# Patient Record
Sex: Female | Born: 1970
Health system: Southern US, Community
[De-identification: ages and names within clinical notes are randomized; demographics above are authoritative.]

## PROBLEM LIST (undated history)

## (undated) DIAGNOSIS — D649 Anemia, unspecified: Secondary | ICD-10-CM

## (undated) DIAGNOSIS — T7840XA Allergy, unspecified, initial encounter: Secondary | ICD-10-CM

## (undated) DIAGNOSIS — J302 Other seasonal allergic rhinitis: Secondary | ICD-10-CM

## (undated) DIAGNOSIS — E785 Hyperlipidemia, unspecified: Secondary | ICD-10-CM

## (undated) DIAGNOSIS — M1990 Unspecified osteoarthritis, unspecified site: Secondary | ICD-10-CM

## (undated) HISTORY — PX: TONSILLECTOMY: SUR1361

## (undated) HISTORY — PX: WISDOM TOOTH EXTRACTION: SHX21

## (undated) HISTORY — DX: Allergy, unspecified, initial encounter: T78.40XA

## (undated) HISTORY — DX: Other seasonal allergic rhinitis: J30.2

## (undated) HISTORY — PX: EYE SURGERY: SHX253

## (undated) HISTORY — DX: Anemia, unspecified: D64.9

## (undated) HISTORY — PX: DILATION AND CURETTAGE OF UTERUS: SHX78

## (undated) HISTORY — DX: Unspecified osteoarthritis, unspecified site: M19.90

## (undated) HISTORY — DX: Hyperlipidemia, unspecified: E78.5

---

## 1999-02-08 ENCOUNTER — Other Ambulatory Visit: Admission: RE | Admit: 1999-02-08 | Discharge: 1999-02-08 | Payer: Self-pay | Admitting: Obstetrics & Gynecology

## 1999-06-18 ENCOUNTER — Ambulatory Visit (HOSPITAL_COMMUNITY): Admission: RE | Admit: 1999-06-18 | Discharge: 1999-06-18 | Payer: Self-pay | Admitting: Obstetrics and Gynecology

## 1999-09-10 ENCOUNTER — Inpatient Hospital Stay (HOSPITAL_COMMUNITY): Admission: AD | Admit: 1999-09-10 | Discharge: 1999-09-11 | Payer: Self-pay | Admitting: Obstetrics & Gynecology

## 1999-10-06 ENCOUNTER — Other Ambulatory Visit: Admission: RE | Admit: 1999-10-06 | Discharge: 1999-10-06 | Payer: Self-pay | Admitting: Obstetrics and Gynecology

## 2000-11-28 ENCOUNTER — Other Ambulatory Visit: Admission: RE | Admit: 2000-11-28 | Discharge: 2000-11-28 | Payer: Self-pay | Admitting: Obstetrics and Gynecology

## 2001-09-05 ENCOUNTER — Inpatient Hospital Stay (HOSPITAL_COMMUNITY): Admission: AD | Admit: 2001-09-05 | Discharge: 2001-09-05 | Payer: Self-pay | Admitting: Obstetrics & Gynecology

## 2001-09-22 ENCOUNTER — Inpatient Hospital Stay (HOSPITAL_COMMUNITY): Admission: AD | Admit: 2001-09-22 | Discharge: 2001-09-22 | Payer: Self-pay | Admitting: Obstetrics & Gynecology

## 2001-12-01 ENCOUNTER — Inpatient Hospital Stay (HOSPITAL_COMMUNITY): Admission: AD | Admit: 2001-12-01 | Discharge: 2001-12-03 | Payer: Self-pay | Admitting: Obstetrics and Gynecology

## 2002-01-03 ENCOUNTER — Other Ambulatory Visit: Admission: RE | Admit: 2002-01-03 | Discharge: 2002-01-03 | Payer: Self-pay | Admitting: Obstetrics and Gynecology

## 2005-05-06 ENCOUNTER — Other Ambulatory Visit: Admission: RE | Admit: 2005-05-06 | Discharge: 2005-05-06 | Payer: Self-pay | Admitting: Obstetrics & Gynecology

## 2008-01-02 ENCOUNTER — Encounter: Admission: RE | Admit: 2008-01-02 | Discharge: 2008-01-02 | Payer: Self-pay | Admitting: Family Medicine

## 2009-06-01 ENCOUNTER — Ambulatory Visit: Payer: Self-pay | Admitting: Family Medicine

## 2009-06-01 DIAGNOSIS — Z8659 Personal history of other mental and behavioral disorders: Secondary | ICD-10-CM | POA: Insufficient documentation

## 2009-06-01 DIAGNOSIS — E039 Hypothyroidism, unspecified: Secondary | ICD-10-CM | POA: Insufficient documentation

## 2009-06-01 DIAGNOSIS — Z862 Personal history of diseases of the blood and blood-forming organs and certain disorders involving the immune mechanism: Secondary | ICD-10-CM | POA: Insufficient documentation

## 2009-06-10 ENCOUNTER — Encounter: Admission: RE | Admit: 2009-06-10 | Discharge: 2009-06-10 | Payer: Self-pay | Admitting: Family Medicine

## 2009-06-11 ENCOUNTER — Encounter: Payer: Self-pay | Admitting: Family Medicine

## 2009-06-18 ENCOUNTER — Ambulatory Visit: Payer: Self-pay | Admitting: Family Medicine

## 2009-06-18 LAB — CONVERTED CEMR LAB
ALT: 16 units/L (ref 0–35)
Albumin: 4.3 g/dL (ref 3.5–5.2)
Alkaline Phosphatase: 43 units/L (ref 39–117)
BUN: 16 mg/dL (ref 6–23)
Bilirubin, Direct: 0.1 mg/dL (ref 0.0–0.3)
Calcium: 9.4 mg/dL (ref 8.4–10.5)
Creatinine, Ser: 0.8 mg/dL (ref 0.4–1.2)
Eosinophils Absolute: 0.1 10*3/uL (ref 0.0–0.7)
Eosinophils Relative: 2.7 % (ref 0.0–5.0)
HCT: 39.3 % (ref 36.0–46.0)
LDL Cholesterol: 115 mg/dL — ABNORMAL HIGH (ref 0–99)
Lymphocytes Relative: 37.6 % (ref 12.0–46.0)
Lymphs Abs: 1.6 10*3/uL (ref 0.7–4.0)
MCV: 95.9 fL (ref 78.0–100.0)
Neutro Abs: 2.1 10*3/uL (ref 1.4–7.7)
Neutrophils Relative %: 50.2 % (ref 43.0–77.0)
Nitrite: NEGATIVE
Platelets: 243 10*3/uL (ref 150.0–400.0)
RDW: 14.4 % (ref 11.5–14.6)
TSH: 3.08 microintl units/mL (ref 0.35–5.50)
Total Bilirubin: 0.6 mg/dL (ref 0.3–1.2)
Total CHOL/HDL Ratio: 3
Triglycerides: 53 mg/dL (ref 0.0–149.0)
Urine Glucose: NEGATIVE mg/dL

## 2009-06-25 ENCOUNTER — Ambulatory Visit: Payer: Self-pay | Admitting: Family Medicine

## 2009-12-14 ENCOUNTER — Ambulatory Visit: Payer: Self-pay | Admitting: Family Medicine

## 2009-12-14 DIAGNOSIS — L259 Unspecified contact dermatitis, unspecified cause: Secondary | ICD-10-CM | POA: Insufficient documentation

## 2010-04-15 NOTE — Assessment & Plan Note (Signed)
Summary: rash on legs/back and side/cjr   Vital Signs:  Patient profile:   40 year old female Menstrual status:  regular Height:      68 inches (172.72 cm) Weight:      158 pounds (71.82 kg) O2 Sat:      98 % on Room air Temp:     98.6 degrees F (37.00 degrees C) oral Pulse rate:   58 / minute BP sitting:   102 / 70  (left arm) Cuff size:   regular  Vitals Entered By: Josph Macho RMA (December 14, 2009 10:30 AM)  O2 Flow:  Room air CC: Rash on legs, back, and side X2 weeks- itching/ CF, Headache Is Patient Diabetic? No   History of Present Illness: Patient is in todayf or evaluaiton of a pruritic rash which has been present for about 2 weeks. She describes the rash is worse over her posterior left buttock. This is where started she has a small scar from where she scratched and smaller newer lesions just below it. She notes that the itching does not keep her up it is often worse upon first arising in the morning right before bedtime q.h.s. She says the lesions are primarily posterior neck spasm on her anterior abdomen. She notes the first lesion cannot just after bathing suit line of the left posterior hip but she has not been swimming in the pool for about a month and the lesions just began 2 weeks ago. She is to use hydrocortisone cream for temporary relief but has not tried any other medications. No one else in her household including several children and her husband have similar lesions. She denies any new products she does note she has recently gone back to using. Ex after using another product for a period of time. No other signs of illness no recent fevers, chills, chest pain, palpitations, respiratory, GI or GU complaints noted at today's visit. She denies any history of similar lesions in the past.  Current Medications (verified): 1)  Ester-C 1000-50 Mg Tabs (Bioflavonoid Products) .... Once Daily 2)  Glucosamine-Chondroitin  Caps (Glucosamine-Chondroit-Vit C-Mn) .... Once  Daily 3)  Womens Multivitamin Plus  Tabs (Multiple Vitamins-Minerals) .... Once Daily 4)  Melatonin 3 Mg Caps (Melatonin) .... At Bedtime 5)  Sam-E 200 Mg Tabs (S-Adenosylmethionine) .... Once Daily 6)  Black Cohosh Root 540 Mg Caps (Black Cohosh) .... Once Daily  Allergies (verified): No Known Drug Allergies  Past History:  Past medical history reviewed for relevance to current acute and chronic problems. Social history (including risk factors) reviewed for relevance to current acute and chronic problems.  Past Medical History: Reviewed history from 06/01/2009 and no changes required. Anemia, hx of Depression, childhood UTI history  Social History: Reviewed history from 06/01/2009 and no changes required. Occupation:  Designer, jewellery Married Alcohol use-no Regular exercise-yes Past smoker, quit 1991  Review of Systems      See HPI  Physical Exam  General:  Well-developed,well-nourished,in no acute distress; alert,appropriate and cooperative throughout examination Head:  Normocephalic and atraumatic without obvious abnormalities. No apparent alopecia or balding. Mouth:  Oral mucosa and oropharynx without lesions or exudates.  Teeth in good repair. Neck:  No deformities, masses, or tenderness noted. Lungs:  Normal respiratory effort, chest expands symmetrically. Lungs are clear to auscultation, no crackles or wheezes. Heart:  Normal rate and regular rhythm. S1 and S2 normal without gallop, murmur, click, rub or other extra sounds. Abdomen:  Bowel sounds positive,abdomen soft and non-tender without masses,  organomegaly or hernias noted. Msk:  No deformity or scoliosis noted of thoracic or lumbar spine.   Extremities:  No clubbing, cyanosis, edema, or deformity noted with normal full range of motion of all joints.   Skin:  left buttock mild scarring noted, circular violaceous spot noted and just caudal are several smaller erythematous spots that are excoriated and slightly  raised to obvious tracking noted betweeen lesions. She has some other scattered pin pinpoint papular, erythematous lesions scattered over legs and buttocks, as well as left arm and lower abd  Psych:  Cognition and judgment appear intact. Alert and cooperative with normal attention span and concentration. No apparent delusions, illusions, hallucinations   Impression & Recommendations:  Problem # 1:  DERMATITIS (ICD-692.9)  Her updated medication list for this problem includes:    Cetirizine Hcl 10 Mg Tabs (Cetirizine hcl) .Marland Kitchen... 1 tab by mouth two times a day as needed allergies Zoonotic vs allergic or some combination Will rx with Permethrin cream with deep cleaning of environment, consider stopping Purex detergent if lesions continue to recur. May treat pruritus with Wynelle Fanny and [facility] as needed. Report if lesions do not resolve  Complete Medication List: 1)  Ester-c 1000-50 Mg Tabs (Bioflavonoid products) .... Once daily 2)  Glucosamine-chondroitin Caps (Glucosamine-chondroit-vit c-mn) .... Once daily 3)  Womens Multivitamin Plus Tabs (Multiple vitamins-minerals) .... Once daily 4)  Melatonin 3 Mg Caps (Melatonin) .... At bedtime 5)  Sam-e 200 Mg Tabs (S-adenosylmethionine) .... Once daily 6)  Black Cohosh Root 540 Mg Caps (Black cohosh) .... Once daily 7)  Cetirizine Hcl 10 Mg Tabs (Cetirizine hcl) .Marland Kitchen.. 1 tab by mouth two times a day as needed allergies 8)  Sarna 0.5-0.5 % Lotn (Camphor-menthol) .... Apply as needed itching/rash 9)  Permethrin 5 % Crea (Permethrin) .... Apply at bedtime head to toe and wash off in am after 8-10 hrs, may repeat in 1 week if needed   Patient Instructions: 1)  Please schedule a follow-up appointment as needed if symptoms worsen. 2)  Use Witch Hazel Astringent as needed for bug bites, rash or itching 3)  Sarna lotion as needed for itching 4)  Avoid any new products if no improvement Prescriptions: PERMETHRIN 5 % CREA (PERMETHRIN) apply at bedtime head  to toe and wash off in am after 8-10 hrs, may repeat in 1 week if needed  #1 tube x 1   Entered and Authorized by:   Danise Edge MD   Signed by:   Danise Edge MD on 12/14/2009   Method used:   Electronically to        [id number] (retail)       [address]       [place], [place]  [address]       Ph: [phone]       Fax: [phone]   RxID:   [id number] CETIRIZINE HCL 10 MG TABS (CETIRIZINE HCL) 1 tab by mouth two times a day as needed allergies  #60 x 2   Entered and Authorized by:   Danise Edge MD   Signed by:   Danise Edge MD on 12/14/2009   Method used:   Historical   RxID:   [id number]

## 2010-04-15 NOTE — Assessment & Plan Note (Signed)
Summary: est [place]//ccm   Vital Signs:  Patient profile:   40 year old female Menstrual status:  regular LMP:     05/12/2009 Height:      68.0 inches Weight:      159 pounds BMI:     24.26 Temp:     98.5 degrees F oral Pulse rate:   72 / minute Pulse rhythm:   regular Resp:     12 per minute BP sitting:   110 / 72  (left arm) Cuff size:   regular  Vitals Entered By: Sid Falcon LPN (June 01, 2009 3:22 PM) CC: New to establish,  LMP (date): 05/12/2009     Menstrual Status regular Enter LMP: 05/12/2009   History of Present Illness: New patient to establish care.  Past medical history reviewed. Remote history of anemia. Remote history of depression in childhood but not for several years. Reported past history of borderline hypothyroidism. History of frequent UTIs in the past. C-Section 1988. Tonsillectomy 1980. Takes over-the-counter supplements. No known drug allergies.    Hypothyroidism has not been evaluated in some time. She was briefly on medication but apparently her labs normalized. No recent weight changes or constipation or other concerning symptoms for hypothyroidism.  Remote history of anemia. Normal menses. No recent dizziness or syncope.  History of depression and childhood. No history of recurrent depression. Current symptoms stable.  Family history significant for father with hypertension and hyperlipidemia. Paternal grandfather with heart disease age 85. Mother with history of depression.  Patient is married and has 5 children. Nonsmoker. No alcohol use. 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  Patient like to schedule complete physical examination. Last tetanus unknown. Last Pap smear 2009.  Preventive Screening-Counseling & Management  Alcohol-Tobacco     Smoking Status: quit     Year Started: 1985     Year Quit: 1991  Caffeine-Diet-Exercise     Does Patient Exercise: yes  Allergies (verified): No Known Drug Allergies  Past  History:  Family History: Last updated: 06/01/2009 Emotional/mental illness, arthritis, mother Heart disease, hypertension, father Breast ccancer, grandmother Kidney disease, grandparent  Social History: Last updated: 06/01/2009 Occupation:  Registered Nurse Married Alcohol use-no Regular exercise-yes Past smoker, quit 1991  Risk Factors: Exercise: yes (06/01/2009)  Risk Factors: Smoking Status: quit (06/01/2009)  Past Medical History: Anemia, hx of Depression, childhood UTI history  Past Surgical History: Caesarean section 1988 Tonsillectomy 1980 PMH-FH-SH reviewed for relevance  Family History: Emotional/mental illness, arthritis, mother Heart disease, hypertension, father Breast ccancer, grandmother Kidney disease, grandparent  Social History: Occupation:  Designer, jewellery Married Alcohol use-no Regular exercise-yes Past smoker, quit 1991 Smoking Status:  quit Occupation:  employed Does Patient Exercise:  yes  Review of Systems  The patient denies anorexia, fever, weight loss, weight gain, chest pain, syncope, dyspnea on exertion, peripheral edema, prolonged cough, headaches, hemoptysis, abdominal pain, melena, hematochezia, severe indigestion/heartburn, suspicious skin lesions, depression, and enlarged lymph nodes.    Physical Exam  General:  Well-developed,well-nourished,in no acute distress; alert,appropriate and cooperative throughout examination Head:  Normocephalic and atraumatic without obvious abnormalities. No apparent alopecia or balding. Eyes:  No corneal or conjunctival inflammation noted. EOMI. Perrla. Funduscopic exam benign, without hemorrhages, exudates or papilledema. Vision grossly normal. Ears:  External ear exam shows no significant lesions or deformities.  Otoscopic examination reveals clear canals, tympanic membranes are intact bilaterally without bulging, retraction, inflammation or discharge. Hearing is grossly normal  bilaterally. Mouth:  Oral mucosa and oropharynx without lesions or exudates.  Teeth  in good repair. Neck:  No deformities, masses, or tenderness noted. Lungs:  Normal respiratory effort, chest expands symmetrically. Lungs are clear to auscultation, no crackles or wheezes. Heart:  Normal rate and regular rhythm. S1 and S2 normal without gallop, murmur, click, rub or other extra sounds. Extremities:  No clubbing, cyanosis, edema, or deformity noted with normal full range of motion of all joints.   Neurologic:  alert & oriented X3 and strength normal in all extremities.   Psych:  normally interactive, good eye contact, not anxious appearing, and not depressed appearing.     Impression & Recommendations:  Problem # 1:  ANEMIA, HX OF (ICD-V12.3)  Problem # 2:  DEPRESSION, MAJOR, HX OF (ICD-V11.8)  Problem # 3:  HYPOTHYROIDISM, BORDERLINE (ICD-244.9) Schedule physical and get followup labs.  Complete Medication List: 1)  Ester-c 1000-50 Mg Tabs (Bioflavonoid products) .... Once daily 2)  Glucosamine-chondroitin Caps (Glucosamine-chondroit-vit c-mn) .... Once daily 3)  Womens Multivitamin Plus Tabs (Multiple vitamins-minerals) .... Once daily 4)  Melatonin 3 Mg Caps (Melatonin) .... At bedtime 5)  Sam-e 200 Mg Tabs (S-adenosylmethionine) .... Once daily 6)  Black Cohosh Root 540 Mg Caps (Black cohosh) .... Once daily  Patient Instructions: 1)  Schedule complete physical examination

## 2010-04-15 NOTE — Assessment & Plan Note (Signed)
Summary: cpx----no pap//ccm   Vital Signs:  Patient profile:   40 year old female Menstrual status:  regular Height:      68 inches Weight:      156 pounds Temp:     98.7 degrees F oral Pulse rate:   72 / minute Pulse rhythm:   regular Resp:     12 per minute BP sitting:   102 / 68  (left arm) Cuff size:   regular  Vitals Entered By: Sid Falcon LPN (June 25, 2009 2:58 PM) CC: CPX   History of Present Illness: Patient here for complete physical examination. Gets GYN care elsewhere.  Recent mammogram normal. Patient recently noted right breast lump normal by ultrasound. Last tetanus unknown. Patient exercises walking 5-6 days per week.  Family history and social history reviewed. No significant changes.  Allergies (verified): No Known Drug Allergies  Past History:  Past Medical History: Last updated: 06/01/2009 Anemia, hx of Depression, childhood UTI history  Past Surgical History: Last updated: 06/01/2009 Caesarean section 1988 Tonsillectomy 1980  Family History: Last updated: 06/01/2009 Emotional/mental illness, arthritis, mother Heart disease, hypertension, father Breast ccancer, grandmother Kidney disease, grandparent  Social History: Last updated: 06/01/2009 Occupation:  Registered Nurse Married Alcohol use-no Regular exercise-yes Past smoker, quit 1991  Risk Factors: Exercise: yes (06/01/2009)  Risk Factors: Smoking Status: quit (06/01/2009)  Review of Systems  The patient denies anorexia, fever, weight loss, weight gain, vision loss, decreased hearing, hoarseness, chest pain, syncope, dyspnea on exertion, peripheral edema, prolonged cough, headaches, hemoptysis, abdominal pain, melena, hematochezia, severe indigestion/heartburn, hematuria, incontinence, genital sores, muscle weakness, suspicious skin lesions, transient blindness, difficulty walking, depression, and enlarged lymph nodes.    Physical Exam  General:   Well-developed,well-nourished,in no acute distress; alert,appropriate and cooperative throughout examination Head:  Normocephalic and atraumatic without obvious abnormalities. No apparent alopecia or balding. Eyes:  No corneal or conjunctival inflammation noted. EOMI. Perrla. Funduscopic exam benign, without hemorrhages, exudates or papilledema. Vision grossly normal. Ears:  External ear exam shows no significant lesions or deformities.  Otoscopic examination reveals clear canals, tympanic membranes are intact bilaterally without bulging, retraction, inflammation or discharge. Hearing is grossly normal bilaterally. Mouth:  Oral mucosa and oropharynx without lesions or exudates.  Teeth in good repair. Neck:  No deformities, masses, or tenderness noted. Lungs:  Normal respiratory effort, chest expands symmetrically. Lungs are clear to auscultation, no crackles or wheezes. Heart:  Normal rate and regular rhythm. S1 and S2 normal without gallop, murmur, click, rub or other extra sounds. Abdomen:  Bowel sounds positive,abdomen soft and non-tender without masses, organomegaly or hernias noted. Genitalia:  gyn Msk:  No deformity or scoliosis noted of thoracic or lumbar spine.   Extremities:  No clubbing, cyanosis, edema, or deformity noted with normal full range of motion of all joints.   Leg length ASIS to med malleolus 92 cm bilaterally. Neurologic:  alert & oriented X3, cranial nerves II-XII intact, strength normal in all extremities, and gait normal.   Skin:  Intact without suspicious lesions or rashes Cervical Nodes:  No lymphadenopathy noted Psych:  normally interactive, good eye contact, not anxious appearing, and not depressed appearing.     Impression & Recommendations:  Problem # 1:  Preventive Health Care (ICD-V70.0) labs reviewed with patient and all favorable. TD booster given. Continue regular exercise.  Complete Medication List: 1)  Ester-c 1000-50 Mg Tabs (Bioflavonoid products)  .... Once daily 2)  Glucosamine-chondroitin Caps (Glucosamine-chondroit-vit c-mn) .... Once daily 3)  Womens Multivitamin Plus Tabs (Multiple  vitamins-minerals) .... Once daily 4)  Melatonin 3 Mg Caps (Melatonin) .... At bedtime 5)  Sam-e 200 Mg Tabs (S-adenosylmethionine) .... Once daily 6)  Black Cohosh Root 540 Mg Caps (Black cohosh) .... Once daily  Other Orders: Tdap => 60yrs IM (16109) Admin 1st Vaccine (60454)  Patient Instructions: 1)  It is important that you exercise reguarly at least 20 minutes 5 times a week. If you develop chest pain, have severe difficulty breathing, or feel very tired, stop exercising immediately and seek medical attention.    Immunizations Administered:  Tetanus Vaccine:    Vaccine Type: Tdap    Site: left deltoid    Mfr: GlaxoSmithKline    Dose: 0.5 ml    Route: IM    Given by: Sid Falcon LPN    Exp. Date: 06/06/2011    Lot #: [id number]

## 2011-06-22 IMAGING — MG MM DIAGNOSTIC BILATERAL
6 series · 6 of 6 positions shown · non-contrast
Comparison: January 02, 2008

CLINICAL DATA: Palpable lump at the right breast

DIGITAL DIAGNOSTIC BILATERAL MAMMOGRAM WITH CAD AND RIGHT BREAST
ULTRASOUND:

[R CC (1 of 2)]
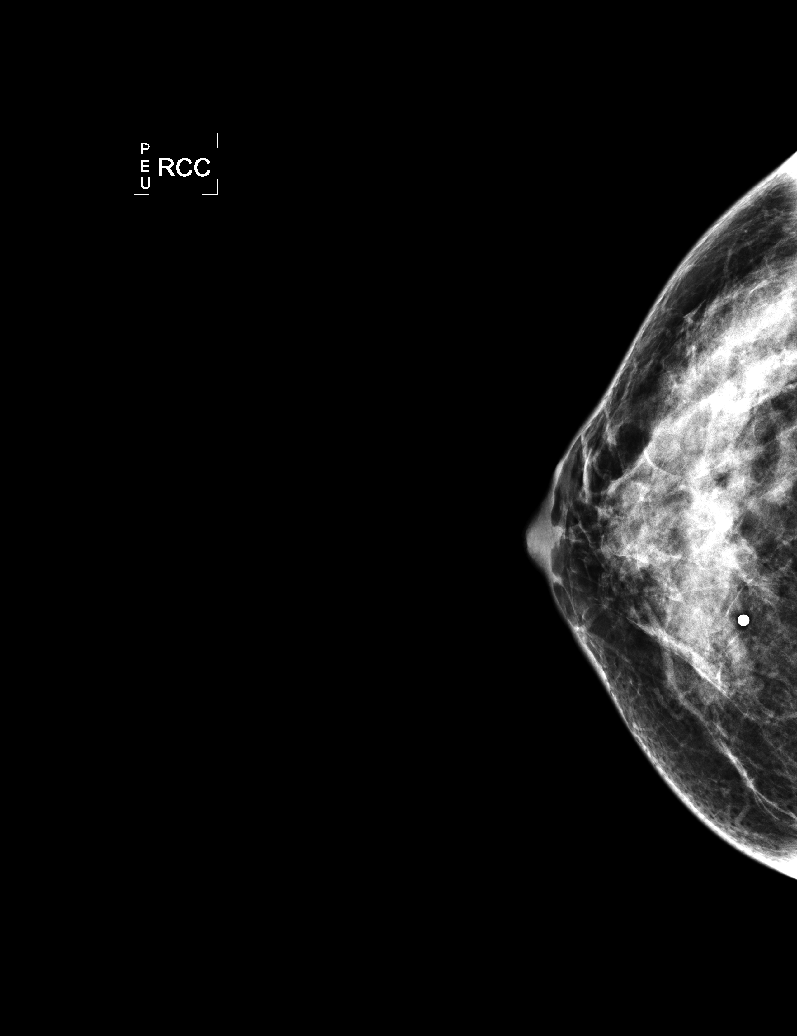

[L CC]
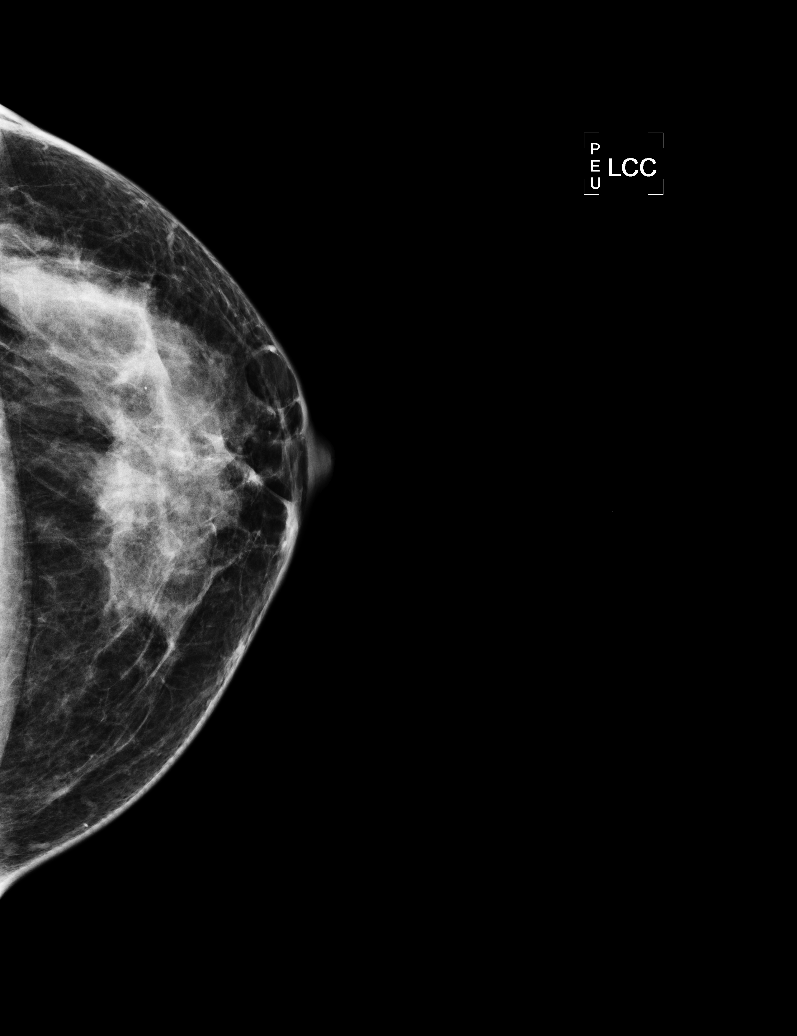

[L MLO]
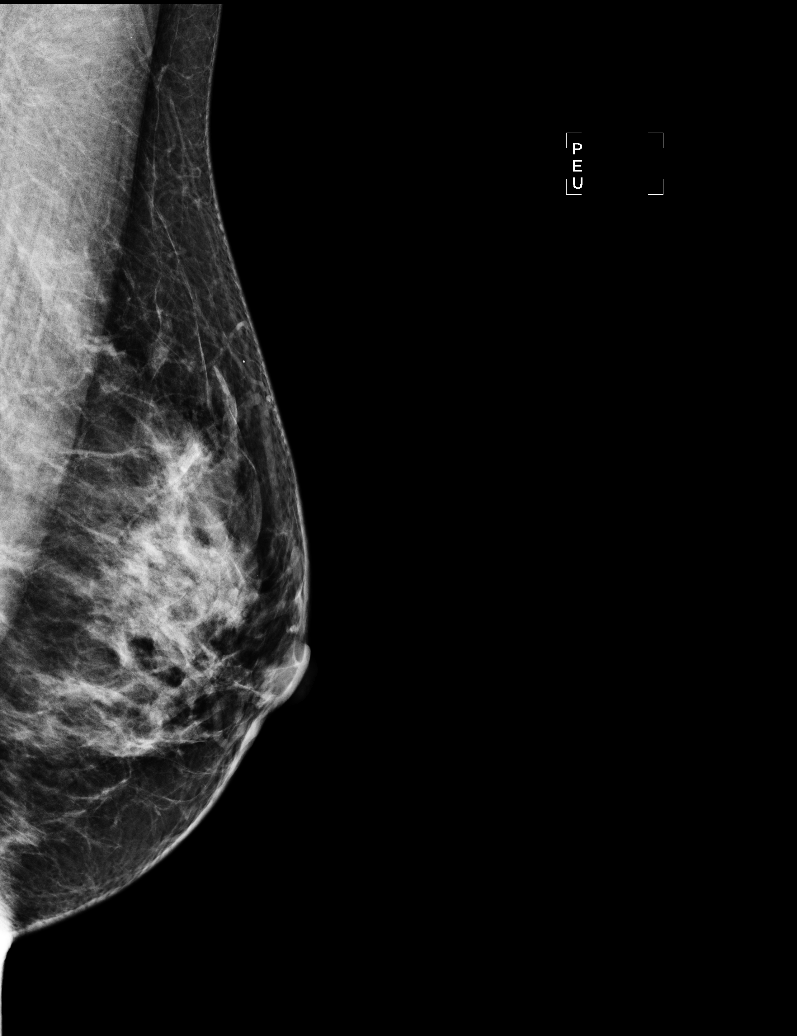

[R MLO]
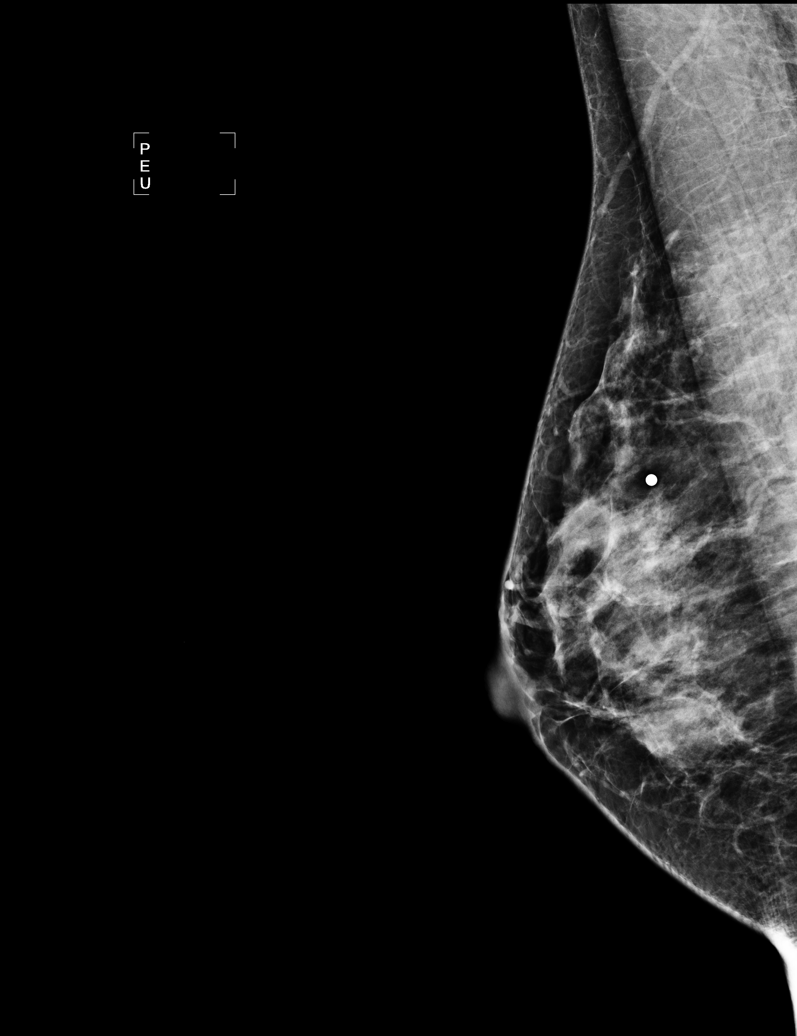

[R TAN]
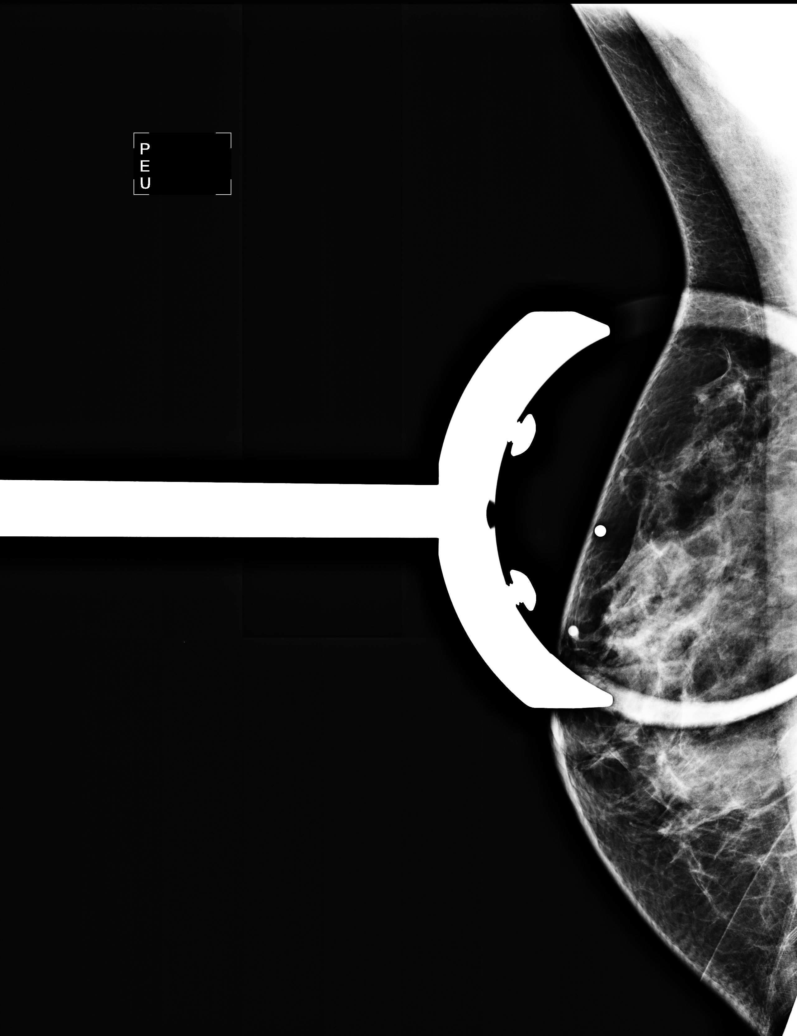

[R CC (2 of 2)]
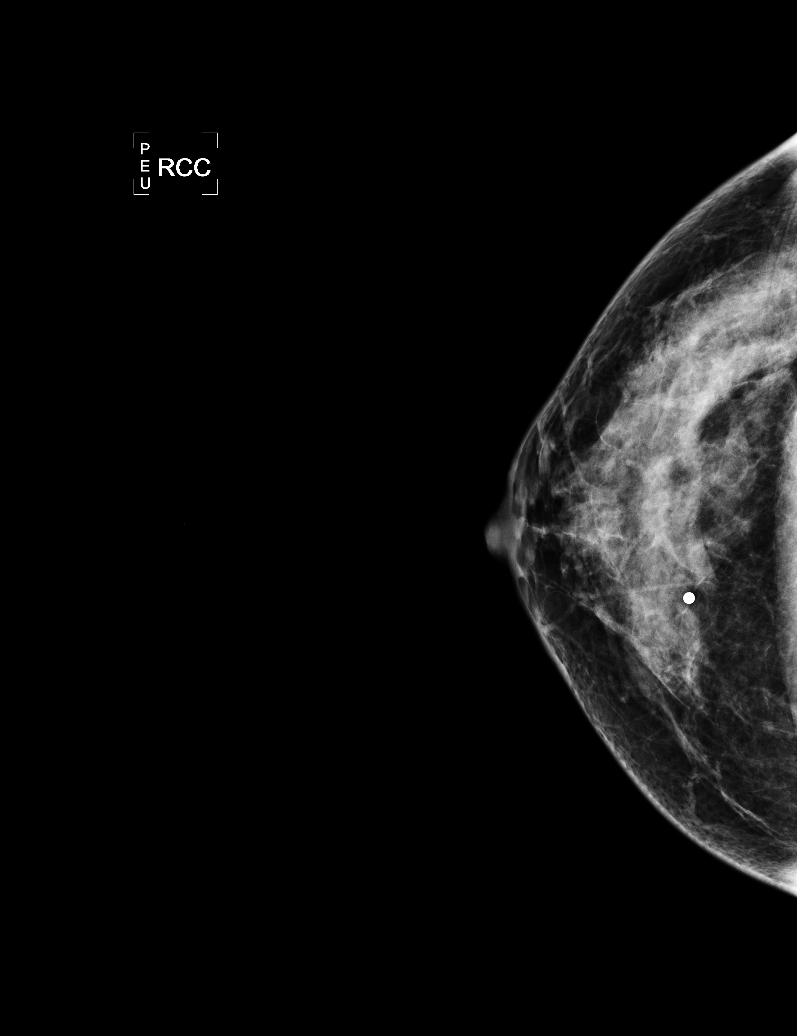

[6 of 6 positions shown; findings below may reference images not displayed]

FINDINGS: CC and MLO views of bilateral breasts, spot tangential
view of right breast are submitted.  The breast parenchyma is
dense, lowering the sensitivity of mammography.  No suspicious
abnormality is identified.
Mammographic images were processed with CAD.

Ultrasound is performed, showing no focal discrete cystic or solid
lesion at the palpable area right breast one o'clock position.
IMPRESSION: Negative, recommend routine screening mammogram at age 41.

BI-RADS CATEGORY 1:  Negative.

## 2013-11-11 ENCOUNTER — Other Ambulatory Visit: Payer: Self-pay | Admitting: Obstetrics and Gynecology

## 2013-11-11 DIAGNOSIS — N6310 Unspecified lump in the right breast, unspecified quadrant: Secondary | ICD-10-CM

## 2013-11-21 ENCOUNTER — Encounter (INDEPENDENT_AMBULATORY_CARE_PROVIDER_SITE_OTHER): Payer: Self-pay

## 2013-11-21 ENCOUNTER — Ambulatory Visit
Admission: RE | Admit: 2013-11-21 | Discharge: 2013-11-21 | Disposition: A | Discharge status: Home / Self Care | Payer: Commercial Managed Care - PPO | Source: Ambulatory Visit | Attending: Obstetrics and Gynecology | Admitting: Obstetrics and Gynecology

## 2013-11-21 DIAGNOSIS — N6310 Unspecified lump in the right breast, unspecified quadrant: Secondary | ICD-10-CM

## 2018-02-12 ENCOUNTER — Encounter: Payer: Self-pay | Admitting: Family Medicine

## 2018-02-12 ENCOUNTER — Other Ambulatory Visit: Payer: Self-pay

## 2018-02-12 ENCOUNTER — Ambulatory Visit (INDEPENDENT_AMBULATORY_CARE_PROVIDER_SITE_OTHER): Payer: 59 | Admitting: Family Medicine

## 2018-02-12 VITALS — BP 96/60 | HR 80 | Temp 98.2°F | Ht 65.75 in | Wt 180.0 lb

## 2018-02-12 DIAGNOSIS — Z0000 Encounter for general adult medical examination without abnormal findings: Secondary | ICD-10-CM

## 2018-02-12 LAB — BASIC METABOLIC PANEL
BUN: 14 mg/dL (ref 6–23)
CHLORIDE: 102 meq/L (ref 96–112)
CO2: 26 mEq/L (ref 19–32)
Calcium: 9.4 mg/dL (ref 8.4–10.5)
Creatinine, Ser: 0.9 mg/dL (ref 0.40–1.20)
GFR: 71.4 mL/min (ref >60.00)
Glucose, Bld: 87 mg/dL (ref 70–99)
POTASSIUM: 4.5 meq/L (ref 3.5–5.1)
SODIUM: 138 meq/L (ref 135–145)

## 2018-02-12 LAB — CBC WITH DIFFERENTIAL/PLATELET
BASOS ABS: 0.1 10*3/uL (ref 0.0–0.1)
Basophils Relative: 1.2 % (ref 0.0–3.0)
Eosinophils Absolute: 0.1 10*3/uL (ref 0.0–0.7)
Eosinophils Relative: 3 % (ref 0.0–5.0)
HEMATOCRIT: 37.1 % (ref 36.0–46.0)
HEMOGLOBIN: 12.2 g/dL (ref 12.0–15.0)
LYMPHS PCT: 33.4 % (ref 12.0–46.0)
Lymphs Abs: 1.6 10*3/uL (ref 0.7–4.0)
MCHC: 33 g/dL (ref 30.0–36.0)
MCV: 89.7 fl (ref 78.0–100.0)
MONOS PCT: 8.2 % (ref 3.0–12.0)
Monocytes Absolute: 0.4 10*3/uL (ref 0.1–1.0)
Neutro Abs: 2.5 10*3/uL (ref 1.4–7.7)
Neutrophils Relative %: 54.2 % (ref 43.0–77.0)
Platelets: 385 10*3/uL (ref 150.0–400.0)
RBC: 4.13 Mil/uL (ref 3.87–5.11)
RDW: 16.2 % — ABNORMAL HIGH (ref 11.5–15.5)
WBC: 4.7 10*3/uL (ref 4.0–10.5)

## 2018-02-12 LAB — HEPATIC FUNCTION PANEL
ALK PHOS: 55 U/L (ref 39–117)
ALT: 15 U/L (ref 0–35)
AST: 17 U/L (ref 0–37)
Albumin: 4.2 g/dL (ref 3.5–5.2)
BILIRUBIN DIRECT: 0.1 mg/dL (ref 0.0–0.3)
TOTAL PROTEIN: 6.9 g/dL (ref 6.0–8.3)
Total Bilirubin: 0.5 mg/dL (ref 0.2–1.2)

## 2018-02-12 LAB — LIPID PANEL
CHOLESTEROL: 216 mg/dL — AB (ref 0–200)
HDL: 64 mg/dL (ref >39.00)
LDL Cholesterol: 131 mg/dL — ABNORMAL HIGH (ref 0–99)
NONHDL: 151.67
TRIGLYCERIDES: 102 mg/dL (ref 0.0–149.0)
Total CHOL/HDL Ratio: 3
VLDL: 20.4 mg/dL (ref 0.0–40.0)

## 2018-02-12 LAB — TSH: TSH: 2.81 u[IU]/mL (ref 0.35–4.50)

## 2018-02-12 NOTE — Progress Notes (Signed)
  Subjective:     Patient ID: Judith Pitts, female   DOB: April 01, 1970, 47 y.o.   MRN: [id number]  HPI Patient seen for physical exam.  She sees gynecologist yearly and gets Pap smears and mammograms from them.  She still needs flu vaccine.  Will need tetanus booster in 2 years.  No family history of colon cancer.  She takes no medications regularly.  She has history of iron deficiency secondary to heavy menses.  Her GYN has her on iron replacement.  She states her last hemoglobin was 11 range.  She has been frustrated with gradual weight gain.  Currently not exercising regularly.  Past Medical History:  Diagnosis Date  . Allergy    Past Surgical History:  Procedure Laterality Date  . CESAREAN SECTION  1988  . EYE SURGERY    . TONSILLECTOMY      reports that she has quit smoking. She has never used smokeless tobacco. She reports that she drinks about 2.0 - 3.0 standard drinks of alcohol per week. She reports that she has current or past drug history. Drug: Marijuana. family history is not on file. No Known Allergies   Review of Systems  Constitutional: Negative for activity change, appetite change, fatigue, fever and unexpected weight change.  HENT: Negative for ear pain, hearing loss, sore throat and trouble swallowing.   Eyes: Negative for visual disturbance.  Respiratory: Negative for cough and shortness of breath.   Cardiovascular: Negative for chest pain and palpitations.  Gastrointestinal: Negative for abdominal pain, blood in stool, constipation and diarrhea.  Genitourinary: Negative for dysuria and hematuria.  Musculoskeletal: Negative for arthralgias, back pain and myalgias.  Skin: Negative for rash.  Neurological: Negative for dizziness, syncope and headaches.  Hematological: Negative for adenopathy.  Psychiatric/Behavioral: Negative for confusion and dysphoric mood.       Objective:   Physical Exam  Constitutional: She is oriented to person, place, and time. She  appears well-developed and well-nourished.  HENT:  Head: Normocephalic and atraumatic.  Eyes: Pupils are equal, round, and reactive to light. EOM are normal.  Neck: Normal range of motion. Neck supple. No thyromegaly present.  Cardiovascular: Normal rate, regular rhythm and normal heart sounds.  No murmur heard. Pulmonary/Chest: Breath sounds normal. No respiratory distress. She has no wheezes. She has no rales.  Abdominal: Soft. Bowel sounds are normal. She exhibits no distension and no mass. There is no tenderness. There is no rebound and no guarding.  Musculoskeletal: Normal range of motion. She exhibits no edema.  Lymphadenopathy:    She has no cervical adenopathy.  Neurological: She is alert and oriented to person, place, and time. She displays normal reflexes. No cranial nerve deficit.  Skin: No rash noted.  Psychiatric: She has a normal mood and affect. Her behavior is normal. Judgment and thought content normal.       Assessment:     Physical exam.  The following health maintenance issues were addressed    Plan:     -She plans to get flu vaccine later today at local pharmacy -Obtain screening labs -Discussed healthy weight loss options.  She will consider calorie tracking options  Kristian CoveyBruce W Burchette MD [facility]

## 2018-03-06 DIAGNOSIS — Z6827 Body mass index (BMI) 27.0-27.9, adult: Secondary | ICD-10-CM | POA: Diagnosis not present

## 2018-03-06 DIAGNOSIS — Z01419 Encounter for gynecological examination (general) (routine) without abnormal findings: Secondary | ICD-10-CM | POA: Diagnosis not present

## 2018-04-06 DIAGNOSIS — N924 Excessive bleeding in the premenopausal period: Secondary | ICD-10-CM | POA: Diagnosis not present

## 2019-02-15 ENCOUNTER — Other Ambulatory Visit: Payer: Self-pay

## 2019-02-15 ENCOUNTER — Ambulatory Visit (INDEPENDENT_AMBULATORY_CARE_PROVIDER_SITE_OTHER): Payer: 59 | Admitting: Family Medicine

## 2019-02-15 ENCOUNTER — Encounter: Payer: Self-pay | Admitting: Family Medicine

## 2019-02-15 VITALS — BP 128/76 | HR 82 | Temp 97.9°F | Ht 68.0 in | Wt 177.1 lb

## 2019-02-15 DIAGNOSIS — Z23 Encounter for immunization: Secondary | ICD-10-CM | POA: Diagnosis not present

## 2019-02-15 DIAGNOSIS — Z0000 Encounter for general adult medical examination without abnormal findings: Secondary | ICD-10-CM | POA: Diagnosis not present

## 2019-02-15 LAB — CBC WITH DIFFERENTIAL/PLATELET
Basophils Absolute: 0 10*3/uL (ref 0.0–0.1)
Basophils Relative: 0.8 % (ref 0.0–3.0)
Eosinophils Absolute: 0.1 10*3/uL (ref 0.0–0.7)
Eosinophils Relative: 3.4 % (ref 0.0–5.0)
HCT: 39.6 % (ref 36.0–46.0)
Hemoglobin: 13.3 g/dL (ref 12.0–15.0)
Lymphocytes Relative: 30.7 % (ref 12.0–46.0)
Lymphs Abs: 1.3 10*3/uL (ref 0.7–4.0)
MCHC: 33.5 g/dL (ref 30.0–36.0)
MCV: 93.2 fl (ref 78.0–100.0)
Monocytes Absolute: 0.4 10*3/uL (ref 0.1–1.0)
Monocytes Relative: 9.8 % (ref 3.0–12.0)
Neutro Abs: 2.4 10*3/uL (ref 1.4–7.7)
Neutrophils Relative %: 55.3 % (ref 43.0–77.0)
Platelets: 280 10*3/uL (ref 150.0–400.0)
RBC: 4.25 Mil/uL (ref 3.87–5.11)
RDW: 14.3 % (ref 11.5–15.5)
WBC: 4.4 10*3/uL (ref 4.0–10.5)

## 2019-02-15 LAB — LIPID PANEL
Cholesterol: 206 mg/dL — ABNORMAL HIGH (ref 0–200)
HDL: 61.6 mg/dL (ref >39.00)
LDL Cholesterol: 129 mg/dL — ABNORMAL HIGH (ref 0–99)
NonHDL: 144.58
Total CHOL/HDL Ratio: 3
Triglycerides: 76 mg/dL (ref 0.0–149.0)
VLDL: 15.2 mg/dL (ref 0.0–40.0)

## 2019-02-15 LAB — HEPATIC FUNCTION PANEL
ALT: 13 U/L (ref 0–35)
AST: 14 U/L (ref 0–37)
Albumin: 4.3 g/dL (ref 3.5–5.2)
Alkaline Phosphatase: 50 U/L (ref 39–117)
Bilirubin, Direct: 0.1 mg/dL (ref 0.0–0.3)
Total Bilirubin: 0.5 mg/dL (ref 0.2–1.2)
Total Protein: 6.7 g/dL (ref 6.0–8.3)

## 2019-02-15 LAB — BASIC METABOLIC PANEL
BUN: 17 mg/dL (ref 6–23)
CO2: 25 mEq/L (ref 19–32)
Calcium: 9.2 mg/dL (ref 8.4–10.5)
Chloride: 105 mEq/L (ref 96–112)
Creatinine, Ser: 0.9 mg/dL (ref 0.40–1.20)
GFR: 66.89 mL/min (ref >60.00)
Glucose, Bld: 96 mg/dL (ref 70–99)
Potassium: 4.3 mEq/L (ref 3.5–5.1)
Sodium: 139 mEq/L (ref 135–145)

## 2019-02-15 LAB — TSH: TSH: 2.67 u[IU]/mL (ref 0.35–4.50)

## 2019-02-15 MED ORDER — TRIAMCINOLONE ACETONIDE 0.1 % EX CREA: 1.0000 "application " | TOPICAL_CREAM | Freq: Two times a day (BID) | CUTANEOUS | 1 refills | Status: AC

## 2019-02-15 NOTE — Patient Instructions (Signed)

## 2019-02-15 NOTE — Addendum Note (Signed)
Addended by: Anibal Henderson on: 02/15/2019 08:06 AM   Modules accepted: Orders

## 2019-02-15 NOTE — Progress Notes (Signed)
Subjective:     Patient ID: Judith Pitts, female   DOB: 1971-02-06, 48 y.o.   MRN: [id number]  HPI   Tresa Endo seen for physical exam.  She sees GYN yearly and is getting yearly mammograms and Pap smears through their office.  Generally very healthy.  Takes no regular medications.  She does relate that she had small abrasion left anterior leg from shaving back in September.  She tried some type of clear Band-Aid and feels like she had a contact allergy from that.  She is had some itching.  She has some oval rash which is exact shape that the Band-Aid was.  She tried only topical hydrocortisone couple times.  She tried some Neosporin without improvement.  Mildly elevated lipids last year but overall low risk for CAD.  Needs flu vaccine.  Also needs tetanus booster.  Non-smoker.  Takes no regular medications.  Past Medical History:  Diagnosis Date  . Allergy    Past Surgical History:  Procedure Laterality Date  . CESAREAN SECTION  1988  . EYE SURGERY    . TONSILLECTOMY      reports that she has quit smoking. She has never used smokeless tobacco. She reports current alcohol use of about 2.0 - 3.0 standard drinks of alcohol per week. She reports previous drug use. Drug: Marijuana. family history is not on file. No Known Allergies   Review of Systems  Constitutional: Negative for activity change, appetite change, fatigue, fever and unexpected weight change.  HENT: Negative for ear pain, hearing loss, sore throat and trouble swallowing.   Eyes: Negative for visual disturbance.  Respiratory: Negative for cough and shortness of breath.   Cardiovascular: Negative for chest pain and palpitations.  Gastrointestinal: Negative for abdominal pain, blood in stool, constipation and diarrhea.  Genitourinary: Negative for dysuria and hematuria.  Musculoskeletal: Negative for arthralgias, back pain and myalgias.  Skin: Positive for rash.  Neurological: Negative for dizziness, syncope and headaches.   Hematological: Negative for adenopathy.  Psychiatric/Behavioral: Negative for confusion and dysphoric mood.       Objective:   Physical Exam Constitutional:      Appearance: She is well-developed.  HENT:     Head: Normocephalic and atraumatic.  Eyes:     Pupils: Pupils are equal, round, and reactive to light.  Neck:     Musculoskeletal: Normal range of motion and neck supple.     Thyroid: No thyromegaly.  Cardiovascular:     Rate and Rhythm: Normal rate and regular rhythm.     Heart sounds: Normal heart sounds. No murmur.  Pulmonary:     Effort: No respiratory distress.     Breath sounds: Normal breath sounds. No wheezing or rales.  Abdominal:     General: Bowel sounds are normal. There is no distension.     Palpations: Abdomen is soft. There is no mass.     Tenderness: There is no abdominal tenderness. There is no guarding or rebound.  Musculoskeletal: Normal range of motion.  Lymphadenopathy:     Cervical: No cervical adenopathy.  Skin:    Findings: Rash present.     Comments: She has somewhat oval-shaped eczematous rash left anterior leg.  No nodular changes.  No pustules.  No vesicles.  No necrosis.  Neurological:     Mental Status: She is alert and oriented to person, place, and time.     Cranial Nerves: No cranial nerve deficit.     Deep Tendon Reflexes: Reflexes normal.  Psychiatric:  Behavior: Behavior normal.        Thought Content: Thought content normal.        Judgment: Judgment normal.        Assessment:     Physical exam.  Generally healthy 47 year old female.  She is getting regular GYN checkups.  She has rash left leg which is probable contact allergy from Band-Aid    Plan:     -Flu vaccine given after patient consent.  We also recommended tetanus booster since her tetanus will run out in April and patient consents -Obtain screening labs -Continue low saturated fat diet and regular exercise habits -Triamcinolone 0.1% cream twice daily to  affected rash  Eulas Post MD [facility]

## 2019-02-17 ENCOUNTER — Encounter: Payer: Self-pay | Admitting: Family Medicine

## 2019-03-13 DIAGNOSIS — M1990 Unspecified osteoarthritis, unspecified site: Secondary | ICD-10-CM | POA: Insufficient documentation

## 2019-03-13 DIAGNOSIS — R87619 Unspecified abnormal cytological findings in specimens from cervix uteri: Secondary | ICD-10-CM | POA: Insufficient documentation

## 2019-03-13 DIAGNOSIS — D649 Anemia, unspecified: Secondary | ICD-10-CM | POA: Insufficient documentation

## 2020-02-19 ENCOUNTER — Ambulatory Visit (INDEPENDENT_AMBULATORY_CARE_PROVIDER_SITE_OTHER): Payer: BC Managed Care – PPO | Admitting: Family Medicine

## 2020-02-19 ENCOUNTER — Encounter: Payer: Self-pay | Admitting: Family Medicine

## 2020-02-19 ENCOUNTER — Other Ambulatory Visit: Payer: Self-pay

## 2020-02-19 VITALS — BP 92/70 | HR 67 | Ht 68.0 in | Wt 182.0 lb

## 2020-02-19 DIAGNOSIS — Z0000 Encounter for general adult medical examination without abnormal findings: Secondary | ICD-10-CM

## 2020-02-19 DIAGNOSIS — Z23 Encounter for immunization: Secondary | ICD-10-CM

## 2020-02-19 LAB — CBC WITH DIFFERENTIAL/PLATELET
Basophils Absolute: 0 10*3/uL (ref 0.0–0.1)
Basophils Relative: 0.7 % (ref 0.0–3.0)
Eosinophils Absolute: 0.1 10*3/uL (ref 0.0–0.7)
Eosinophils Relative: 1.9 % (ref 0.0–5.0)
HCT: 41 % (ref 36.0–46.0)
Hemoglobin: 13.6 g/dL (ref 12.0–15.0)
Lymphocytes Relative: 27.8 % (ref 12.0–46.0)
Lymphs Abs: 1.4 10*3/uL (ref 0.7–4.0)
MCHC: 33.3 g/dL (ref 30.0–36.0)
MCV: 95.8 fl (ref 78.0–100.0)
Monocytes Absolute: 0.4 10*3/uL (ref 0.1–1.0)
Monocytes Relative: 7.8 % (ref 3.0–12.0)
Neutro Abs: 3.1 10*3/uL (ref 1.4–7.7)
Neutrophils Relative %: 61.8 % (ref 43.0–77.0)
Platelets: 265 10*3/uL (ref 150.0–400.0)
RBC: 4.28 Mil/uL (ref 3.87–5.11)
RDW: 13.1 % (ref 11.5–15.5)
WBC: 5 10*3/uL (ref 4.0–10.5)

## 2020-02-19 LAB — LIPID PANEL
Cholesterol: 200 mg/dL (ref 0–200)
HDL: 66.8 mg/dL (ref >39.00)
LDL Cholesterol: 115 mg/dL — ABNORMAL HIGH (ref 0–99)
NonHDL: 133.61
Total CHOL/HDL Ratio: 3
Triglycerides: 94 mg/dL (ref 0.0–149.0)
VLDL: 18.8 mg/dL (ref 0.0–40.0)

## 2020-02-19 LAB — HEPATIC FUNCTION PANEL
ALT: 13 U/L (ref 0–35)
AST: 15 U/L (ref 0–37)
Albumin: 4.4 g/dL (ref 3.5–5.2)
Alkaline Phosphatase: 56 U/L (ref 39–117)
Bilirubin, Direct: 0.1 mg/dL (ref 0.0–0.3)
Total Bilirubin: 0.4 mg/dL (ref 0.2–1.2)
Total Protein: 6.9 g/dL (ref 6.0–8.3)

## 2020-02-19 LAB — BASIC METABOLIC PANEL
BUN: 18 mg/dL (ref 6–23)
CO2: 28 mEq/L (ref 19–32)
Calcium: 9.4 mg/dL (ref 8.4–10.5)
Chloride: 103 mEq/L (ref 96–112)
Creatinine, Ser: 0.92 mg/dL (ref 0.40–1.20)
GFR: 73.48 mL/min (ref >60.00)
Glucose, Bld: 92 mg/dL (ref 70–99)
Potassium: 4.1 mEq/L (ref 3.5–5.1)
Sodium: 140 mEq/L (ref 135–145)

## 2020-02-19 LAB — TSH: TSH: 4.01 u[IU]/mL (ref 0.35–4.50)

## 2020-02-19 NOTE — Progress Notes (Addendum)
Established Patient Office Visit  Subjective:  Patient ID: Judith Pitts, female    DOB: 05/30/1970  Age: 49 y.o. MRN: [id number]  CC:  Chief Complaint  Patient presents with  . Annual Exam    HPI [name] presents for physical exam.  Generally doing well.  She works for [employer].  They have a few grandchildren all the way.  Her son and his wife will be expecting twins this year.  Laina takes no prescription medications.  Still sees GYN regularly.  The 10-year ASCVD risk score Denman George DC Montez Hageman., et al., 2013) is: 0.4%   Values used to calculate the score:     Age: 21 years     Sex: Female     Is Non-Hispanic African American: No     Diabetic: No     Tobacco smoker: No     Systolic Blood Pressure: 92 mmHg     Is BP treated: No     HDL Cholesterol: 66.8 mg/dL     Total Cholesterol: 200 mg/dL   Health maintenance reviewed  -Needs flu vaccine -Tetanus due 2030 -She had Covid vaccines but does not have a date -No history of hepatitis C screening but low risk -Pap smear up-to-date  Family history-both parents have hypertension history.  Mom has history of atrial fibrillation.  Both parents have history of diastolic dysfunction.  No family history of diabetes  Social history-she is married.  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  Non-smoker.  No regular alcohol.    Past Medical History:  Diagnosis Date  . Allergy     Past Surgical History:  Procedure Laterality Date  . CESAREAN SECTION  1988  . EYE SURGERY    . TONSILLECTOMY      Family History  Problem Relation Age of Onset  . Heart disease Mother   . Hypertension Mother   . Obesity Mother   . Heart disease Father   . Hypertension Father   . Obesity Father   . Arthritis Father     Social History   Socioeconomic History  . Marital status: Single    Spouse name: Not on file  . Number of children: Not on file  . Years of education: Not on file  . Highest education level: Not on file  Occupational  History  . Not on file  Tobacco Use  . Smoking status: Former Games developer  . Smokeless tobacco: Never Used  Vaping Use  . Vaping Use: Never used  Substance and Sexual Activity  . Alcohol use: Yes    Alcohol/week: 2.0 - 3.0 standard drinks    Types: 2 - 3 Glasses of wine per week    Comment: Per week  . Drug use: Not Currently    Types: Marijuana  . Sexual activity: Yes  Other Topics Concern  . Not on file  Social History Narrative  . Not on file   Social Determinants of Health   Financial Resource Strain:   . Difficulty of Paying Living Expenses: Not on file  Food Insecurity:   . Worried About Programme researcher, broadcasting/film/video in the Last Year: Not on file  . Ran Out of Food in the Last Year: Not on file  Transportation Needs:   . Lack of Transportation (Medical): Not on file  . Lack of Transportation (Non-Medical): Not on file  Physical Activity:   . Days of Exercise per Week: Not on file  . Minutes of Exercise per Session: Not on file  Stress:   . Feeling of Stress : Not on file  Social Connections:   . Frequency of Communication with Friends and Family: Not on file  . Frequency of Social Gatherings with Friends and Family: Not on file  . Attends Religious Services: Not on file  . Active Member of Clubs or Organizations: Not on file  . Attends Banker Meetings: Not on file  . Marital Status: Not on file  Intimate Partner Violence:   . Fear of Current or Ex-Partner: Not on file  . Emotionally Abused: Not on file  . Physically Abused: Not on file  . Sexually Abused: Not on file    Outpatient Medications Prior to Visit  Medication Sig Dispense Refill  . cetirizine (ZYRTEC) 10 MG tablet Take 10 mg by mouth daily.    . Prenatal Vit-Fe Fumarate-FA (MULTIVITAMIN-PRENATAL) 27-0.8 MG TABS tablet Take 1 tablet by mouth daily at 12 noon. Takes for anemia    . triamcinolone cream (KENALOG) 0.1 % Apply 1 application topically 2 (two) times daily. 30 g 1  . Multiple  Vitamins-Minerals (MULTIVITAMIN ADULT PO) Take 1 tablet by mouth daily.    . norethindrone (MICRONOR) 0.35 MG tablet Take 1 tablet by mouth daily.     No facility-administered medications prior to visit.    No Known Allergies  ROS Review of Systems  Constitutional: Negative for activity change, appetite change, fatigue, fever and unexpected weight change.  HENT: Negative for ear pain, hearing loss, sore throat and trouble swallowing.   Eyes: Negative for visual disturbance.  Respiratory: Negative for cough and shortness of breath.   Cardiovascular: Negative for chest pain and palpitations.  Gastrointestinal: Negative for abdominal pain, blood in stool, constipation and diarrhea.  Genitourinary: Negative for dysuria and hematuria.  Musculoskeletal: Negative for arthralgias, back pain and myalgias.  Skin: Negative for rash.  Neurological: Negative for dizziness, syncope and headaches.  Hematological: Negative for adenopathy.  Psychiatric/Behavioral: Negative for confusion and dysphoric mood.      Objective:    Physical Exam Constitutional:      Appearance: She is well-developed.  HENT:     Head: Normocephalic and atraumatic.  Eyes:     Pupils: Pupils are equal, round, and reactive to light.  Neck:     Thyroid: No thyromegaly.  Cardiovascular:     Rate and Rhythm: Normal rate and regular rhythm.     Heart sounds: Normal heart sounds. No murmur heard.   Pulmonary:     Effort: No respiratory distress.     Breath sounds: Normal breath sounds. No wheezing or rales.  Abdominal:     General: Bowel sounds are normal. There is no distension.     Palpations: Abdomen is soft. There is no mass.     Tenderness: There is no abdominal tenderness. There is no guarding or rebound.  Musculoskeletal:        General: Normal range of motion.     Cervical back: Normal range of motion and neck supple.     Right lower leg: No edema.     Left lower leg: No edema.  Lymphadenopathy:      Cervical: No cervical adenopathy.  Skin:    Findings: No rash.  Neurological:     Mental Status: She is alert and oriented to person, place, and time.     Cranial Nerves: No cranial nerve deficit.     Deep Tendon Reflexes: Reflexes normal.  Psychiatric:        Behavior: Behavior normal.  Thought Content: Thought content normal.        Judgment: Judgment normal.     BP 92/70   Pulse 67   Ht 5\' 8"  (1.727 m)   Wt 182 lb (82.6 kg)   LMP 02/01/2020   BMI 27.67 kg/m  Wt Readings from Last 3 Encounters:  02/19/20 182 lb (82.6 kg)  02/15/19 177 lb 1.6 oz (80.3 kg)  02/12/18 180 lb (81.6 kg)     Health Maintenance Due  Topic Date Due  . Hepatitis C Screening  Never done  . COVID-19 Vaccine (1) Never done  . HIV Screening  Never done  . INFLUENZA VACCINE  10/13/2019  . PAP SMEAR-Modifier  02/23/2020    There are no preventive care reminders to display for this patient.  Lab Results  Component Value Date   TSH 2.67 02/15/2019   Lab Results  Component Value Date   WBC 4.4 02/15/2019   HGB 13.3 02/15/2019   HCT 39.6 02/15/2019   MCV 93.2 02/15/2019   PLT 280.0 02/15/2019   Lab Results  Component Value Date   NA 139 02/15/2019   K 4.3 02/15/2019   CO2 25 02/15/2019   GLUCOSE 96 02/15/2019   BUN 17 02/15/2019   CREATININE 0.90 02/15/2019   BILITOT 0.5 02/15/2019   ALKPHOS 50 02/15/2019   AST 14 02/15/2019   ALT 13 02/15/2019   PROT 6.7 02/15/2019   ALBUMIN 4.3 02/15/2019   CALCIUM 9.2 02/15/2019   GFR 66.89 02/15/2019   Lab Results  Component Value Date   CHOL 206 (H) 02/15/2019   Lab Results  Component Value Date   HDL 61.60 02/15/2019   Lab Results  Component Value Date   LDLCALC 129 (H) 02/15/2019   Lab Results  Component Value Date   TRIG 76.0 02/15/2019   Lab Results  Component Value Date   CHOLHDL 3 02/15/2019   No results found for: HGBA1C    Assessment & Plan:   Problem List Items Addressed This Visit    None    Visit  Diagnoses    Physical exam    -  Primary   Relevant Orders   Basic metabolic panel   Lipid panel   CBC with Differential/Platelet   TSH   Hepatic function panel   Hep C Antibody    -Flu vaccine given -She continues to see GYN for Pap smears and mammogram -Check labs including hepatitis C antibody -We discussed weight management.  She has very strong family history of obesity.  Continue regular exercise habits and scale back portion sizes and reduce carbohydrates  No orders of the defined types were placed in this encounter.   Follow-up: No follow-ups on file.    14/06/2018, MD

## 2020-02-19 NOTE — Addendum Note (Signed)
Addended by: Judd Gaudier on: 02/19/2020 07:42 AM   Modules accepted: Orders

## 2020-02-19 NOTE — Patient Instructions (Signed)

## 2020-02-19 NOTE — Addendum Note (Signed)
Addended by: Lerry Liner on: 02/19/2020 07:56 AM   Modules accepted: Orders

## 2020-02-19 NOTE — Progress Notes (Signed)
Labs look great.  Mild LDL cholesterol elevation of 115 but 10-year risk for CAD is only 0.4%.  Continue low saturated fat diet.  Other labs including CBC, liver function, thyroid, chemistries all look good

## 2020-02-19 NOTE — Addendum Note (Signed)
Addended by: Lerry Liner on: 02/19/2020 07:57 AM   Modules accepted: Orders

## 2020-02-20 LAB — HEPATITIS C ANTIBODY
Hepatitis C Ab: NONREACTIVE
SIGNAL TO CUT-OFF: 0.01 (ref <1.00)

## 2020-05-07 DIAGNOSIS — N393 Stress incontinence (female) (male): Secondary | ICD-10-CM | POA: Diagnosis not present

## 2020-05-07 DIAGNOSIS — R3915 Urgency of urination: Secondary | ICD-10-CM | POA: Diagnosis not present

## 2020-05-07 DIAGNOSIS — N3941 Urge incontinence: Secondary | ICD-10-CM | POA: Diagnosis not present

## 2020-06-12 DIAGNOSIS — Z6828 Body mass index (BMI) 28.0-28.9, adult: Secondary | ICD-10-CM | POA: Diagnosis not present

## 2020-06-12 DIAGNOSIS — Z01419 Encounter for gynecological examination (general) (routine) without abnormal findings: Secondary | ICD-10-CM | POA: Diagnosis not present

## 2020-06-12 DIAGNOSIS — Z1231 Encounter for screening mammogram for malignant neoplasm of breast: Secondary | ICD-10-CM | POA: Diagnosis not present

## 2020-09-03 ENCOUNTER — Encounter: Payer: Self-pay | Admitting: Gastroenterology

## 2020-10-19 DIAGNOSIS — R635 Abnormal weight gain: Secondary | ICD-10-CM | POA: Diagnosis not present

## 2020-10-19 DIAGNOSIS — N926 Irregular menstruation, unspecified: Secondary | ICD-10-CM | POA: Diagnosis not present

## 2020-10-30 ENCOUNTER — Ambulatory Visit (AMBULATORY_SURGERY_CENTER): Payer: BC Managed Care – PPO

## 2020-10-30 ENCOUNTER — Other Ambulatory Visit: Payer: Self-pay

## 2020-10-30 VITALS — Ht 69.0 in | Wt 180.0 lb

## 2020-10-30 DIAGNOSIS — Z1211 Encounter for screening for malignant neoplasm of colon: Secondary | ICD-10-CM

## 2020-10-30 MED ORDER — PLENVU 140 G PO SOLR: 1.0000 | ORAL | 0 refills | Status: DC

## 2020-10-30 NOTE — Progress Notes (Signed)
Pre visit completed via phone call; Patient verified name, DOB, and address; No egg or soy allergy known to patient  No issues with past sedation with any surgeries or procedures Patient denies ever being told they had issues or difficulty with intubation  No FH of Malignant Hyperthermia No diet pills per patient No home 02 use per patient  No blood thinners per patient  Pt denies issues with constipation at this time; No A fib or A flutter  EMMI video via MyChart  COVID 19 guidelines implemented in PV today with Pt and RN   Pt is fully vaccinated for Covid x 2;  Coupon given to pt in PV today , Code to Pharmacy and  NO PA's for preps discussed with pt in PV today  Discussed with pt there will be an out-of-pocket cost for prep and that varies from $0 to 70 +  dollars   Due to the COVID-19 pandemic we are asking patients to follow certain guidelines.  Pt aware of COVID protocols and [facility] guidelines   

## 2020-11-09 ENCOUNTER — Encounter: Payer: Self-pay | Admitting: Gastroenterology

## 2020-11-13 ENCOUNTER — Other Ambulatory Visit: Payer: Self-pay

## 2020-11-13 ENCOUNTER — Ambulatory Visit (AMBULATORY_SURGERY_CENTER): Payer: BC Managed Care – PPO | Admitting: Gastroenterology

## 2020-11-13 ENCOUNTER — Encounter: Payer: Self-pay | Admitting: Gastroenterology

## 2020-11-13 VITALS — BP 103/68 | HR 56 | Temp 98.4°F | Resp 13 | Ht 69.0 in | Wt 180.0 lb

## 2020-11-13 DIAGNOSIS — Z1211 Encounter for screening for malignant neoplasm of colon: Secondary | ICD-10-CM | POA: Diagnosis not present

## 2020-11-13 MED ORDER — SODIUM CHLORIDE 0.9 % IV SOLN: 500.0000 mL | Freq: Once | INTRAVENOUS | Status: DC

## 2020-11-13 NOTE — Progress Notes (Signed)
Vss nad pt awake transfeered to pacu

## 2020-11-13 NOTE — Patient Instructions (Signed)
Read all of the handouts given to you by your recovery room nurse.  YOU HAD AN ENDOSCOPIC PROCEDURE TODAY AT THE [facility]:   Refer to the procedure report that was given to you for any specific questions about what was found during the examination.  If the procedure report does not answer your questions, please call your gastroenterologist to clarify.  If you requested that your care partner not be given the details of your procedure findings, then the procedure report has been included in a sealed envelope for you to review at your convenience later.  YOU SHOULD EXPECT: Some feelings of bloating in the abdomen. Passage of more gas than usual.  Walking can help get rid of the air that was put into your GI tract during the procedure and reduce the bloating. If you had a lower endoscopy (such as a colonoscopy or flexible sigmoidoscopy) you may notice spotting of blood in your stool or on the toilet paper. If you underwent a bowel prep for your procedure, you may not have a normal bowel movement for a few days.  Please Note:  You might notice some irritation and congestion in your nose or some drainage.  This is from the oxygen used during your procedure.  There is no need for concern and it should clear up in a day or so.  SYMPTOMS TO REPORT IMMEDIATELY:  Following lower endoscopy (colonoscopy or flexible sigmoidoscopy):  Excessive amounts of blood in the stool  Significant tenderness or worsening of abdominal pains  Swelling of the abdomen that is new, acute  Fever of 100F or higher   For urgent or emergent issues, a gastroenterologist can be reached at any hour by calling [phone]. Do not use MyChart messaging for urgent concerns.    DIET:  We do recommend a small meal at first, but then you may proceed to your regular diet.  Drink plenty of fluids but you should avoid alcoholic beverages for 24 hours.  Try to increase the fiber in your diet, and drink plenty of  water.  ACTIVITY:  You should plan to take it easy for the rest of today and you should NOT DRIVE or use heavy machinery until tomorrow (because of the sedation medicines used during the test).    FOLLOW UP: Our staff will call the number listed on your records 48-72 hours following your procedure to check on you and address any questions or concerns that you may have regarding the information given to you following your procedure. If we do not reach you, we will leave a message.  We will attempt to reach you two times.  During this call, we will ask if you have developed any symptoms of COVID 19. If you develop any symptoms (ie: fever, flu-like symptoms, shortness of breath, cough etc.) before then, please call [phone].  If you test positive for Covid 19 in the 2 weeks post procedure, please call and report this information to us.     SIGNATURES/CONFIDENTIALITY: You and/or your care partner have signed paperwork which will be entered into your electronic medical record.  These signatures attest to the fact that that the information above on your After Visit Summary has been reviewed and is understood.  Full responsibility of the confidentiality of this discharge information lies with you and/or your care-partner.  

## 2020-11-13 NOTE — Progress Notes (Signed)
[name] History and Physical   Primary Care Physician:  Kristian Covey, MD   Reason for Procedure:   Colon cancer screening  Plan:    Screening colonoscopy     HPI: Judith Pitts is a 50 y.o. female undergoing initial average risk screening colonoscopy.  She has a family history of polyps, but no colon cancer.  No chronic GI symptoms   Past Medical History:  Diagnosis Date   Anemia    hx of   Arthritis    bilateral knees/hips   Hyperlipidemia    diet controlled   Seasonal allergies     Past Surgical History:  Procedure Laterality Date   CESAREAN SECTION  1988   DILATION AND CURETTAGE OF UTERUS     x 2   EYE SURGERY  1983   TONSILLECTOMY     WISDOM TOOTH EXTRACTION      Prior to Admission medications   Medication Sig Start Date End Date Taking? Authorizing Provider  norethindrone (MICRONOR) 0.35 MG tablet Take 1 tablet by mouth daily. 10/19/20  Yes [provider]  cetirizine (ZYRTEC) 10 MG tablet Take 10 mg by mouth daily. Patient not taking: No sig reported    [provider]  phentermine (ADIPEX-P) 37.5 MG tablet Take 37.5 mg by mouth daily. 10/19/20   [provider]  Prenatal Vit-Fe Fumarate-FA (MULTIVITAMIN-PRENATAL) 27-0.8 MG TABS tablet Take 1 tablet by mouth daily at 12 noon. Takes for anemia    [provider]  triamcinolone cream (KENALOG) 0.1 % Apply 1 application topically 2 (two) times daily. Patient taking differently: Apply 1 application topically daily as needed. 02/15/19   Burchette, Elberta Fortis, MD    Current Outpatient Medications  Medication Sig Dispense Refill   norethindrone (MICRONOR) 0.35 MG tablet Take 1 tablet by mouth daily.     cetirizine (ZYRTEC) 10 MG tablet Take 10 mg by mouth daily. (Patient not taking: No sig reported)     phentermine (ADIPEX-P) 37.5 MG tablet Take 37.5 mg by mouth daily.     Prenatal Vit-Fe Fumarate-FA (MULTIVITAMIN-PRENATAL) 27-0.8 MG TABS tablet Take 1 tablet by  mouth daily at 12 noon. Takes for anemia     triamcinolone cream (KENALOG) 0.1 % Apply 1 application topically 2 (two) times daily. (Patient taking differently: Apply 1 application topically daily as needed.) 30 g 1   Current Facility-Administered Medications  Medication Dose Route Frequency Provider Last Rate Last Admin   0.9 %  sodium chloride infusion  500 mL Intravenous Once Jenel Lucks, MD        Allergies as of 11/13/2020   (No Known Allergies)    Family History  Problem Relation Age of Onset   Heart disease Mother    Hypertension Mother    Obesity Mother    Colon polyps Father    Heart disease Father    Hypertension Father    Obesity Father    Arthritis Father    Colon polyps Sister 66   Colon polyps Paternal Grandmother    Colitis Neg Hx    Colon cancer Neg Hx    Esophageal cancer Neg Hx    Stomach cancer Neg Hx    Rectal cancer Neg Hx     Social History   Socioeconomic History   Marital status: Single    Spouse name: Not on file   Number of children: Not on file   Years of education: Not on file   Highest education level: Not on file  Occupational  History   Not on file  Tobacco Use   Smoking status: Former   Smokeless tobacco: Never  Vaping Use   Vaping Use: Never used  Substance and Sexual Activity   Alcohol use: Not Currently    Alcohol/week: 3.0 standard drinks    Types: 3 Standard drinks or equivalent per week   Drug use: Not Currently    Types: Marijuana    Comment: patient said they used marijuana when they were 50 years old   Sexual activity: Yes  Other Topics Concern   Not on file  Social History Narrative   Not on file   Social Determinants of Health   Financial Resource Strain: Not on file  Food Insecurity: Not on file  Transportation Needs: Not on file  Physical Activity: Not on file  Stress: Not on file  Social Connections: Not on file  Intimate Partner Violence: Not on file    Review of Systems: All other review of  systems negative except as mentioned in the HPI.  Physical Exam: Vital signs BP 121/77   Pulse 76   Temp 98.4 F (36.9 C)   Ht 5\' 9"  (1.753 m)   Wt 180 lb (81.6 kg)   SpO2 98%   BMI 26.58 kg/m   General:   Alert,  Well-developed, well-nourished, pleasant Caucasian female in NAD MP2 Lungs:  Clear throughout to auscultation.   Heart:  Regular rate and rhythm; no murmurs, clicks, rubs,  or gallops. Abdomen:  Soft, nontender and nondistended. Normal bowel sounds.   Neuro/Psych:  Alert and cooperative. Normal mood and affect. A and O x 3   Deshonda Cryderman E. , MD [facility]

## 2020-11-13 NOTE — Progress Notes (Signed)
VS completed by SM.  Pt's states no medical or surgical changes since previsit or office visit.  

## 2020-11-13 NOTE — Op Note (Signed)
[facility] Patient Name: Judith Pitts Procedure Date: 11/13/2020 8:43 AM MRN: [id number] Endoscopist: Lorin Picket E. Tomasa Rand , MD Age: 50 Referring MD:  Date of Birth: Jan 31, 1971 Gender: Female Account #: [id number] Procedure:                Colonoscopy Indications:              Screening for colorectal malignant neoplasm, This                            is the patient's first colonoscopy Medicines:                Monitored Anesthesia Care Procedure:                Pre-Anesthesia Assessment:                           - Prior to the procedure, a History and Physical                            was performed, and patient medications and                            allergies were reviewed. The patient's tolerance of                            previous anesthesia was also reviewed. The risks                            and benefits of the procedure and the sedation                            options and risks were discussed with the patient.                            All questions were answered, and informed consent                            was obtained. Prior Anticoagulants: The patient has                            taken no previous anticoagulant or antiplatelet                            agents. ASA Grade Assessment: II - A patient with                            mild systemic disease. After reviewing the risks                            and benefits, the patient was deemed in                            satisfactory condition to undergo the procedure.  After obtaining informed consent, the colonoscope                            was passed under direct vision. Throughout the                            procedure, the patient's blood pressure, pulse, and                            oxygen saturations were monitored continuously. The                            CF [id number] #[id number] was introduced through the anus                            and advanced to the  the terminal ileum, with                            identification of the appendiceal orifice and IC                            valve. The colonoscopy was performed without                            difficulty. The patient tolerated the procedure                            well. The quality of the bowel preparation was                            excellent. The terminal ileum, ileocecal valve,                            appendiceal orifice, and rectum were photographed.                            The bowel preparation used was Plenvu via split                            dose instruction. Scope In: 8:49:59 AM Scope Out: 9:06:43 AM Scope Withdrawal Time: 0 hours 10 minutes 22 seconds  Total Procedure Duration: 0 hours 16 minutes 44 seconds  Findings:                 The perianal and digital rectal examinations were                            normal. Pertinent negatives include normal                            sphincter tone and no palpable rectal lesions.                           Many small and large-mouthed diverticula were found  in the sigmoid colon and descending colon.                           The exam was otherwise normal throughout the                            examined colon.                           The terminal ileum appeared normal.                           The retroflexed view of the distal rectum and anal                            verge was normal and showed no anal or rectal                            abnormalities. Complications:            No immediate complications. Estimated Blood Loss:     Estimated blood loss: none. Impression:               - Diverticulosis in the sigmoid colon and in the                            descending colon.                           - The examined portion of the ileum was normal.                           - The distal rectum and anal verge are normal on                            retroflexion view.                            - No specimens collected. Recommendation:           - Patient has a contact number available for                            emergencies. The signs and symptoms of potential                            delayed complications were discussed with the                            patient. Return to normal activities tomorrow.                            Written discharge instructions were provided to the                            patient.                           -  Resume previous diet.                           - Continue present medications.                           - Repeat colonoscopy in 10 years for screening                            purposes. Starnisha Batrez E. Tomasa Rand, MD 11/13/2020 9:12:01 AM This report has been signed electronically.

## 2020-11-17 ENCOUNTER — Telehealth: Payer: Self-pay | Admitting: *Deleted

## 2020-11-17 NOTE — Telephone Encounter (Signed)
  Follow up Call-  Call back number 11/13/2020  Post procedure Call Back phone  # [phone]  Permission to leave phone message Yes  Some recent data might be hidden     Patient questions:  Do you have a fever, pain , or abdominal swelling? No. Pain Score  0 *  Have you tolerated food without any problems? Yes.    Have you been able to return to your normal activities? Yes.    Do you have any questions about your discharge instructions: Diet   No. Medications  No. Follow up visit  No.  Do you have questions or concerns about your Care? No.  Actions: * If pain score is 4 or above: No action needed, pain <4.  Have you developed a fever since your procedure? no  2.   Have you had an respiratory symptoms (SOB or cough) since your procedure? no  3.   Have you tested positive for COVID 19 since your procedure no  4.   Have you had any family members/close contacts diagnosed with the COVID 19 since your procedure?  no   If yes to any of these questions please route to Laverna Peace, RN and Karlton Lemon, RN

## 2021-01-25 DIAGNOSIS — N920 Excessive and frequent menstruation with regular cycle: Secondary | ICD-10-CM | POA: Diagnosis not present

## 2021-02-19 ENCOUNTER — Ambulatory Visit (INDEPENDENT_AMBULATORY_CARE_PROVIDER_SITE_OTHER): Payer: BC Managed Care – PPO | Admitting: Family Medicine

## 2021-02-19 VITALS — BP 124/70 | HR 75 | Temp 98.4°F | Wt 186.2 lb

## 2021-02-19 DIAGNOSIS — Z23 Encounter for immunization: Secondary | ICD-10-CM

## 2021-02-19 DIAGNOSIS — Z0000 Encounter for general adult medical examination without abnormal findings: Secondary | ICD-10-CM

## 2021-02-19 DIAGNOSIS — E785 Hyperlipidemia, unspecified: Secondary | ICD-10-CM | POA: Diagnosis not present

## 2021-02-19 LAB — LIPID PANEL
Cholesterol: 240 mg/dL — ABNORMAL HIGH (ref 0–200)
HDL: 74.9 mg/dL (ref >39.00)
LDL Cholesterol: 146 mg/dL — ABNORMAL HIGH (ref 0–99)
NonHDL: 165.49
Total CHOL/HDL Ratio: 3
Triglycerides: 98 mg/dL (ref 0.0–149.0)
VLDL: 19.6 mg/dL (ref 0.0–40.0)

## 2021-02-19 LAB — CBC WITH DIFFERENTIAL/PLATELET
Basophils Absolute: 0 10*3/uL (ref 0.0–0.1)
Basophils Relative: 1.1 % (ref 0.0–3.0)
Eosinophils Absolute: 0.2 10*3/uL (ref 0.0–0.7)
Eosinophils Relative: 4 % (ref 0.0–5.0)
HCT: 40.6 % (ref 36.0–46.0)
Hemoglobin: 13.4 g/dL (ref 12.0–15.0)
Lymphocytes Relative: 33.8 % (ref 12.0–46.0)
Lymphs Abs: 1.4 10*3/uL (ref 0.7–4.0)
MCHC: 32.9 g/dL (ref 30.0–36.0)
MCV: 89.8 fl (ref 78.0–100.0)
Monocytes Absolute: 0.4 10*3/uL (ref 0.1–1.0)
Monocytes Relative: 8.2 % (ref 3.0–12.0)
Neutro Abs: 2.2 10*3/uL (ref 1.4–7.7)
Neutrophils Relative %: 52.9 % (ref 43.0–77.0)
Platelets: 303 10*3/uL (ref 150.0–400.0)
RBC: 4.52 Mil/uL (ref 3.87–5.11)
RDW: 15.2 % (ref 11.5–15.5)
WBC: 4.3 10*3/uL (ref 4.0–10.5)

## 2021-02-19 LAB — HEPATIC FUNCTION PANEL
ALT: 14 U/L (ref 0–35)
AST: 17 U/L (ref 0–37)
Albumin: 4.2 g/dL (ref 3.5–5.2)
Alkaline Phosphatase: 53 U/L (ref 39–117)
Bilirubin, Direct: 0.1 mg/dL (ref 0.0–0.3)
Total Bilirubin: 0.3 mg/dL (ref 0.2–1.2)
Total Protein: 7.5 g/dL (ref 6.0–8.3)

## 2021-02-19 LAB — BASIC METABOLIC PANEL
BUN: 18 mg/dL (ref 6–23)
CO2: 26 mEq/L (ref 19–32)
Calcium: 9.7 mg/dL (ref 8.4–10.5)
Chloride: 104 mEq/L (ref 96–112)
Creatinine, Ser: 0.98 mg/dL (ref 0.40–1.20)
GFR: 67.64 mL/min (ref >60.00)
Glucose, Bld: 88 mg/dL (ref 70–99)
Potassium: 4.4 mEq/L (ref 3.5–5.1)
Sodium: 139 mEq/L (ref 135–145)

## 2021-02-19 LAB — TSH: TSH: 1.91 u[IU]/mL (ref 0.35–5.50)

## 2021-02-19 NOTE — Progress Notes (Signed)
Established Patient Office Visit  Subjective:  Patient ID: Judith Pitts, female    DOB: 1970-04-05  Age: 50 y.o. MRN: [id number]  CC:  Chief Complaint  Patient presents with   Annual Exam    HPI Judith Pitts presents for physical exam.  She sees gynecologist yearly for her mammograms and Pap smears.  She had colonoscopy and September which was normal.  She would like to consider shingles vaccine at age 67.  She is generally doing well.  She has had increased challenges with looking after both her parents who have multiple medical concerns.  -Family history reviewed.  Both parents have hypertension.  Mother has atrial fibrillation.  Both have history of morbid obesity.  No family history of type 2 diabetes or premature CAD.  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  Non-smoker.  No alcohol.  She has 3 grandchildren.  Past Medical History:  Diagnosis Date   Anemia    hx of   Arthritis    bilateral knees/hips   Hyperlipidemia    diet controlled   Seasonal allergies     Past Surgical History:  Procedure Laterality Date   CESAREAN SECTION  1988   DILATION AND CURETTAGE OF UTERUS     x 2   EYE SURGERY  1983   TONSILLECTOMY     WISDOM TOOTH EXTRACTION      Family History  Problem Relation Age of Onset   Heart disease Mother    Hypertension Mother    Obesity Mother    Colon polyps Father    Heart disease Father    Hypertension Father    Obesity Father    Arthritis Father    Colon polyps Sister 64   Colon polyps Paternal Grandmother    Colitis Neg Hx    Colon cancer Neg Hx    Esophageal cancer Neg Hx    Stomach cancer Neg Hx    Rectal cancer Neg Hx     Social History   Socioeconomic History   Marital status: Single    Spouse name: Not on file   Number of children: Not on file   Years of education: Not on file   Highest education level: Not on file  Occupational History   Not on file  Tobacco Use   Smoking status: Former    Smokeless tobacco: Never  Vaping Use   Vaping Use: Never used  Substance and Sexual Activity   Alcohol use: Not Currently    Alcohol/week: 3.0 standard drinks    Types: 3 Standard drinks or equivalent per week   Drug use: Not Currently    Types: Marijuana    Comment: patient said they used marijuana when they were 50 years old   Sexual activity: Yes  Other Topics Concern   Not on file  Social History Narrative   Not on file   Social Determinants of Health   Financial Resource Strain: Not on file  Food Insecurity: Not on file  Transportation Needs: Not on file  Physical Activity: Not on file  Stress: Not on file  Social Connections: Not on file  Intimate Partner Violence: Not on file    Outpatient Medications Prior to Visit  Medication Sig Dispense Refill   cetirizine (ZYRTEC) 10 MG tablet Take 10 mg by mouth daily.     norethindrone (MICRONOR) 0.35 MG tablet Take 1 tablet by mouth daily.     phentermine (ADIPEX-P) 37.5 MG tablet Take 37.5 mg by mouth  daily.     Prenatal Vit-Fe Fumarate-FA (MULTIVITAMIN-PRENATAL) 27-0.8 MG TABS tablet Take 1 tablet by mouth daily at 12 noon. Takes for anemia     triamcinolone cream (KENALOG) 0.1 % Apply 1 application topically 2 (two) times daily. (Patient taking differently: Apply 1 application topically daily as needed.) 30 g 1   No facility-administered medications prior to visit.    No Known Allergies  ROS Review of Systems  Constitutional:  Negative for activity change, appetite change, fatigue, fever and unexpected weight change.  HENT:  Negative for ear pain, hearing loss, sore throat and trouble swallowing.   Eyes:  Negative for visual disturbance.  Respiratory:  Negative for cough and shortness of breath.   Cardiovascular:  Negative for chest pain and palpitations.  Gastrointestinal:  Negative for abdominal pain, blood in stool, constipation and diarrhea.  Genitourinary:  Negative for dysuria and hematuria.  Musculoskeletal:   Negative for arthralgias, back pain and myalgias.  Skin:  Negative for rash.  Neurological:  Negative for dizziness, syncope and headaches.  Hematological:  Negative for adenopathy.  Psychiatric/Behavioral:  Negative for confusion and dysphoric mood.      Objective:    Physical Exam Constitutional:      Appearance: She is well-developed.  HENT:     Head: Normocephalic and atraumatic.  Neck:     Thyroid: No thyromegaly.  Cardiovascular:     Rate and Rhythm: Normal rate and regular rhythm.     Heart sounds: Normal heart sounds. No murmur heard. Pulmonary:     Effort: No respiratory distress.     Breath sounds: Normal breath sounds. No wheezing or rales.  Abdominal:     General: Bowel sounds are normal. There is no distension.     Palpations: Abdomen is soft. There is no mass.     Tenderness: There is no abdominal tenderness. There is no guarding or rebound.  Musculoskeletal:     Cervical back: Normal range of motion and neck supple.     Right lower leg: No edema.     Left lower leg: No edema.  Lymphadenopathy:     Cervical: No cervical adenopathy.  Skin:    Findings: No rash.  Neurological:     Mental Status: She is alert and oriented to person, place, and time.     Cranial Nerves: No cranial nerve deficit.  Psychiatric:        Behavior: Behavior normal.        Thought Content: Thought content normal.        Judgment: Judgment normal.    BP 124/70 (BP Location: Left Arm, Patient Position: Sitting, Cuff Size: Normal)   Pulse 75   Temp 98.4 F (36.9 C) (Oral)   Wt 186 lb 3.2 oz (84.5 kg)   SpO2 98%   BMI 27.50 kg/m  Wt Readings from Last 3 Encounters:  02/19/21 186 lb 3.2 oz (84.5 kg)  11/13/20 180 lb (81.6 kg)  10/30/20 180 lb (81.6 kg)     Health Maintenance Due  Topic Date Due   COVID-19 Vaccine (1) Never done   HIV Screening  Never done   INFLUENZA VACCINE  10/12/2020    There are no preventive care reminders to display for this patient.  Lab Results   Component Value Date   TSH 4.01 02/19/2020   Lab Results  Component Value Date   WBC 5.0 02/19/2020   HGB 13.6 02/19/2020   HCT 41.0 02/19/2020   MCV 95.8 02/19/2020   PLT 265.0 02/19/2020  Lab Results  Component Value Date   NA 140 02/19/2020   K 4.1 02/19/2020   CO2 28 02/19/2020   GLUCOSE 92 02/19/2020   BUN 18 02/19/2020   CREATININE 0.92 02/19/2020   BILITOT 0.4 02/19/2020   ALKPHOS 56 02/19/2020   AST 15 02/19/2020   ALT 13 02/19/2020   PROT 6.9 02/19/2020   ALBUMIN 4.4 02/19/2020   CALCIUM 9.4 02/19/2020   GFR 73.48 02/19/2020   Lab Results  Component Value Date   CHOL 200 02/19/2020   Lab Results  Component Value Date   HDL 66.80 02/19/2020   Lab Results  Component Value Date   LDLCALC 115 (H) 02/19/2020   Lab Results  Component Value Date   TRIG 94.0 02/19/2020   Lab Results  Component Value Date   CHOLHDL 3 02/19/2020   No results found for: HGBA1C    Assessment & Plan:   Problem List Items Addressed This Visit   None Visit Diagnoses     Physical exam    -  Primary   Relevant Orders   Basic metabolic panel   Lipid panel   CBC with Differential/Platelet   TSH   Hepatic function panel     -Consider Shingrix vaccine at age 71 -Flu vaccine given -Obtain screening labs as above -She plans to continue regular follow-up with GYN for Pap smears and mammogram   No orders of the defined types were placed in this encounter.   Follow-up: No follow-ups on file.    Carolann Littler, MD

## 2021-07-22 DIAGNOSIS — Z01419 Encounter for gynecological examination (general) (routine) without abnormal findings: Secondary | ICD-10-CM | POA: Diagnosis not present

## 2021-07-22 DIAGNOSIS — Z6828 Body mass index (BMI) 28.0-28.9, adult: Secondary | ICD-10-CM | POA: Diagnosis not present

## 2021-07-22 DIAGNOSIS — Z1231 Encounter for screening mammogram for malignant neoplasm of breast: Secondary | ICD-10-CM | POA: Diagnosis not present

## 2022-01-26 ENCOUNTER — Telehealth: Payer: Self-pay | Admitting: Family Medicine

## 2022-01-26 NOTE — Telephone Encounter (Signed)
Patient's daughter dropped off some paperwork needing to be completed by PCP. Paperwork was placed in folder for completion and patient form was completed and attached also.      Please advise

## 2022-01-28 ENCOUNTER — Telehealth: Payer: Self-pay

## 2022-01-28 NOTE — Telephone Encounter (Signed)
Contacted patient and inform her the health insurance form is ready for her to pick up. Verbalized understanding.   Form is placed at the FD in file cabinet.

## 2022-05-25 ENCOUNTER — Encounter: Payer: Self-pay | Admitting: Family Medicine

## 2022-05-25 ENCOUNTER — Ambulatory Visit (INDEPENDENT_AMBULATORY_CARE_PROVIDER_SITE_OTHER): Payer: BC Managed Care – PPO | Admitting: Family Medicine

## 2022-05-25 VITALS — BP 120/82 | HR 70 | Temp 98.2°F | Ht 69.0 in | Wt 193.6 lb

## 2022-05-25 DIAGNOSIS — Z0000 Encounter for general adult medical examination without abnormal findings: Secondary | ICD-10-CM | POA: Diagnosis not present

## 2022-05-25 LAB — CBC WITH DIFFERENTIAL/PLATELET
Basophils Absolute: 0 10*3/uL (ref 0.0–0.1)
Basophils Relative: 1.1 % (ref 0.0–3.0)
Eosinophils Absolute: 0.1 10*3/uL (ref 0.0–0.7)
Eosinophils Relative: 3.2 % (ref 0.0–5.0)
HCT: 42.6 % (ref 36.0–46.0)
Hemoglobin: 14.6 g/dL (ref 12.0–15.0)
Lymphocytes Relative: 38.1 % (ref 12.0–46.0)
Lymphs Abs: 1.7 10*3/uL (ref 0.7–4.0)
MCHC: 34.3 g/dL (ref 30.0–36.0)
MCV: 95.4 fl (ref 78.0–100.0)
Monocytes Absolute: 0.4 10*3/uL (ref 0.1–1.0)
Monocytes Relative: 8.5 % (ref 3.0–12.0)
Neutro Abs: 2.1 10*3/uL (ref 1.4–7.7)
Neutrophils Relative %: 49.1 % (ref 43.0–77.0)
Platelets: 290 10*3/uL (ref 150.0–400.0)
RBC: 4.46 Mil/uL (ref 3.87–5.11)
RDW: 12.7 % (ref 11.5–15.5)
WBC: 4.3 10*3/uL (ref 4.0–10.5)

## 2022-05-25 LAB — HEPATIC FUNCTION PANEL
ALT: 41 U/L — ABNORMAL HIGH (ref 0–35)
AST: 26 U/L (ref 0–37)
Albumin: 4.3 g/dL (ref 3.5–5.2)
Alkaline Phosphatase: 59 U/L (ref 39–117)
Bilirubin, Direct: 0.1 mg/dL (ref 0.0–0.3)
Total Bilirubin: 0.6 mg/dL (ref 0.2–1.2)
Total Protein: 7.4 g/dL (ref 6.0–8.3)

## 2022-05-25 LAB — LIPID PANEL
Cholesterol: 222 mg/dL — ABNORMAL HIGH (ref 0–200)
HDL: 69.6 mg/dL (ref >39.00)
LDL Cholesterol: 136 mg/dL — ABNORMAL HIGH (ref 0–99)
NonHDL: 152.5
Total CHOL/HDL Ratio: 3
Triglycerides: 83 mg/dL (ref 0.0–149.0)
VLDL: 16.6 mg/dL (ref 0.0–40.0)

## 2022-05-25 LAB — BASIC METABOLIC PANEL
BUN: 18 mg/dL (ref 6–23)
CO2: 27 mEq/L (ref 19–32)
Calcium: 9.4 mg/dL (ref 8.4–10.5)
Chloride: 104 mEq/L (ref 96–112)
Creatinine, Ser: 0.92 mg/dL (ref 0.40–1.20)
GFR: 72.32 mL/min (ref >60.00)
Glucose, Bld: 97 mg/dL (ref 70–99)
Potassium: 4.2 mEq/L (ref 3.5–5.1)
Sodium: 139 mEq/L (ref 135–145)

## 2022-05-25 MED ORDER — WEGOVY 0.25 MG/0.5ML ~~LOC~~ SOAJ: 0.2500 mg | SUBCUTANEOUS | 1 refills | Status: DC

## 2022-05-25 NOTE — Progress Notes (Signed)
Established Patient Office Visit  Subjective   Patient ID: Judith Pitts, female    DOB: [birth year]  Age: 52 y.o. MRN: [id number]  Chief Complaint  Patient presents with   Annual Exam    HPI   Judith Pitts is seen for physical exam.  She has had very stressful year with her parents both having declining health.  Her father has dementia and her mom has multiple medical problems and frequently lashes out in anger at several family members.  They are looking at possible extended care options for both of them soon.  Judith Pitts is frustrated with recent weight gain.  She exercises regularly but has still had some gradual weight gain.  She has increasing arthritis issues in her hips and knees and knows that the weight gain is exacerbating that.  She has tried weight watchers without much success.  She has also tried low calorie diets, keto diet, and intermittent fasting without much success.  Health maintenance reviewed:  -Prior hepatitis C screen negative -Colonoscopy due 2032 -Tetanus due 2030 -Pap smear up-to-date -She had mammogram last year and plans to set up 1 for this spring -No history of Shingrix vaccine.  She does plan to check on insurance coverage.  Family history-father has morbid obesity and type 2 diabetes and hypertension.  Also has probable Alzheimer's dementia.  Her mother has history of obesity, atrial fibrillation, hypertension  Social history-she is married.  She has 2 children and 7 grandchildren.  Non-smoker.  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  Past Medical History:  Diagnosis Date   Anemia    hx of   Arthritis    bilateral knees/hips   Hyperlipidemia    diet controlled   Seasonal allergies    Past Surgical History:  Procedure Laterality Date   CESAREAN SECTION  1988   DILATION AND CURETTAGE OF UTERUS     x 2   EYE SURGERY  1983   TONSILLECTOMY     WISDOM TOOTH EXTRACTION      reports that she has quit smoking. She has never used  smokeless tobacco. She reports that she does not currently use alcohol after a past usage of about 3.0 standard drinks of alcohol per week. She reports that she does not currently use drugs after having used the following drugs: Marijuana. family history includes Arthritis in her father; Colon polyps in her father and paternal grandmother; Colon polyps (age of onset: 39) in her sister; Heart disease in her father and mother; Hypertension in her father and mother; Obesity in her father and mother. No Known Allergies   Review of Systems  Constitutional:  Negative for chills, fever, malaise/fatigue and weight loss.  HENT:  Negative for hearing loss.   Eyes:  Negative for blurred vision and double vision.  Respiratory:  Negative for cough and shortness of breath.   Cardiovascular:  Negative for chest pain, palpitations and leg swelling.  Gastrointestinal:  Negative for abdominal pain, blood in stool, constipation and diarrhea.  Genitourinary:  Negative for dysuria.  Skin:  Negative for rash.  Neurological:  Negative for dizziness, speech change, seizures, loss of consciousness and headaches.  Psychiatric/Behavioral:  Negative for depression.       Objective:     BP 120/82 (BP Location: Left Arm, Patient Position: Sitting, Cuff Size: Normal)   Pulse 70   Temp 98.2 F (36.8 C) (Oral)   Ht '5\' 9"'$  (1.753 m)   Wt 193 lb 9.6 oz (87.8 kg)  SpO2 98%   BMI 28.59 kg/m  BP Readings from Last 3 Encounters:  05/25/22 120/82  02/19/21 124/70  11/13/20 103/68   Wt Readings from Last 3 Encounters:  05/25/22 193 lb 9.6 oz (87.8 kg)  02/19/21 186 lb 3.2 oz (84.5 kg)  11/13/20 180 lb (81.6 kg)      Physical Exam Vitals reviewed.  Constitutional:      Appearance: She is well-developed.  HENT:     Head: Normocephalic and atraumatic.  Eyes:     Pupils: Pupils are equal, round, and reactive to light.  Neck:     Thyroid: No thyromegaly.  Cardiovascular:     Rate and Rhythm: Normal rate and  regular rhythm.     Heart sounds: Normal heart sounds. No murmur heard. Pulmonary:     Effort: No respiratory distress.     Breath sounds: Normal breath sounds. No wheezing or rales.  Abdominal:     General: Bowel sounds are normal. There is no distension.     Palpations: Abdomen is soft. There is no mass.     Tenderness: There is no abdominal tenderness. There is no guarding or rebound.  Musculoskeletal:        General: Normal range of motion.     Cervical back: Normal range of motion and neck supple.     Right lower leg: No edema.     Left lower leg: No edema.  Lymphadenopathy:     Cervical: No cervical adenopathy.  Skin:    Findings: No rash.  Neurological:     Mental Status: She is alert and oriented to person, place, and time.     Cranial Nerves: No cranial nerve deficit.  Psychiatric:        Behavior: Behavior normal.        Thought Content: Thought content normal.        Judgment: Judgment normal.      No results found for any visits on 05/25/22.    The 10-year ASCVD risk score (Arnett DK, et al., 2019) is: 1%    Assessment & Plan:   Problem List Items Addressed This Visit   None Visit Diagnoses     Physical exam    -  Primary   Relevant Orders   Basic metabolic panel   Lipid panel   CBC with Differential/Platelet   Hepatic function panel     We discussed several health maintenance items as follows  -Discussed at some length her recent weight gain and importance of losing weight.  She is struggled with many things as above including weight watchers, intermittent fasting, low calorie diets, keto diets.  She specifically has interest in possible weight loss medications.  We mention medication such as Mancel Parsons but that insurance coverage may be challenging.  She is has no contraindications such as pancreatitis or family history of thyroid cancer.  Will try starting Wegovy 0.25 mg subcutaneous once weekly and after 1 month if tolerating well titrate to 0.5 mg  subcutaneous once weekly and recommend 65-monthfollow-up.  Reviewed potential side effects.  -Discussed Shingrix vaccine and she will check on insurance coverage  -She plans to set up repeat mammogram this year  -Other health maintenance as above up-to-date  Return in about 2 months (around 07/25/2022).    BCarolann Littler MD

## 2022-05-25 NOTE — Patient Instructions (Signed)
Consider Shingrix vaccine and check on insurance coverage first.  Start the [facility] 0.25 mg [place] once weekly and then after one month if tolerating well may increase to 0.5 mg weekly

## 2022-05-26 MED ORDER — ZEPBOUND 2.5 MG/0.5ML ~~LOC~~ SOAJ: 2.5000 mg | SUBCUTANEOUS | 0 refills | Status: DC

## 2022-06-02 ENCOUNTER — Telehealth: Payer: Self-pay | Admitting: Family Medicine

## 2022-06-02 DIAGNOSIS — Z0189 Encounter for other specified special examinations: Secondary | ICD-10-CM

## 2022-06-02 DIAGNOSIS — G4733 Obstructive sleep apnea (adult) (pediatric): Secondary | ICD-10-CM

## 2022-06-02 NOTE — Telephone Encounter (Signed)
I spoke with the patient and she reported that her husband notices she stops breathing at night and she is taking deep gasp and snoring during her sleep. Patient also stated she wakes up feeling sweaty and winded and wanted to check to make sure she does not have sleep apnea.

## 2022-06-02 NOTE — Telephone Encounter (Signed)
Pt call and stated she want a referral for a sleep study and want a call back.

## 2022-06-03 NOTE — Telephone Encounter (Signed)
Spoke with the patient, informed her the referral was placed as below and someone will contact her with appt info.

## 2022-06-23 ENCOUNTER — Encounter: Payer: Self-pay | Admitting: Adult Health

## 2022-06-23 ENCOUNTER — Ambulatory Visit: Payer: BC Managed Care – PPO | Admitting: Adult Health

## 2022-06-23 VITALS — BP 100/60 | HR 78 | Ht 67.0 in | Wt 196.2 lb

## 2022-06-23 DIAGNOSIS — R0683 Snoring: Secondary | ICD-10-CM | POA: Diagnosis not present

## 2022-06-23 NOTE — Patient Instructions (Signed)
Set up for home sleep study. Healthy sleep regimen. Work on healthy weight loss Do not drive if sleepy Follow-up in 6 weeks to discuss sleep study results and treatment plan.  

## 2022-06-23 NOTE — Assessment & Plan Note (Signed)
Snoring, restless sleep, daytime sleepiness all suspicious for underlying sleep apnea.  Will set patient up for home sleep study.  Patient education was given.  - discussed how weight can impact sleep and risk for sleep disordered breathing - discussed options to assist with weight loss: combination of diet modification, cardiovascular and strength training exercises   - had an extensive discussion regarding the adverse health consequences related to untreated sleep disordered breathing - specifically discussed the risks for hypertension, coronary artery disease, cardiac dysrhythmias, cerebrovascular disease, and diabetes - lifestyle modification discussed   - discussed how sleep disruption can increase risk of accidents, particularly when driving - safe driving practices were discussed   Plan  Patient Instructions  Set up for home sleep study Healthy sleep regimen Work on healthy weight loss Do not drive if sleepy Follow-up in 6 weeks to discuss sleep study results and treatment plan

## 2022-06-23 NOTE — Progress Notes (Signed)
Did not get flu vaccine for 2023.  Had first 2 pf[employer] covid vaccines.

## 2022-06-23 NOTE — Progress Notes (Signed)
EAS  @Patient  ID: Judith Pitts, female    DOB: 11-Sep-1970, 52 y.o.   MRN: [id number]  Chief Complaint  Patient presents with   Consult    Referring provider: Kristian Covey, MD  HPI: 52 year old female seen for sleep consult on June 23, 2022 for snoring and daytime sleepiness  TEST/EVENTS :   06/23/2022 Sleep consult  Patient presents for sleep consult today.  Kindly referred by primary care provider Dr. Caryl Never. Patient complains of loud snoring, waking up gasping for air, night sweats, restless sleep and daytime sleepiness.  Patient goes to bed about 10 PM.  Takes about 10 minutes to go to sleep.  Is up a multiple times throughout the night.  Gets up at 5:30 AM.  Has 4 cups of coffee in am.  Weight is up about 20 pounds over the last 2 years.  Current weight is at 196 pounds with a BMI at 30.  Patient does not take any sleep aids.  Hs tried melatonin in past without much help. She is currently working on weight loss.  No symptoms suspicious for cataplexy or sleep paralysis.  Has never had a sleep study before.  Has Invisalign retainer from braces that she wears each night .  Epworth score is 10 out of 24.  Typically gets sleepy if she sits down to rest, watch TV passenger of car and in the evening hours. No naps.  Snoring is disruptive to spouses sleep.  Husband does have OSA on CPAP At bedtime  .   SH: Patient is married , lives at home with her husband.  Has 5 children.  Age 65-35.  Former smoker.  Social alcohol.  No drug use.  Patient says she is very active exercises on a regular basis.  Past Surgical History:  Procedure Laterality Date   CESAREAN SECTION  1988   DILATION AND CURETTAGE OF UTERUS     x 2   EYE SURGERY  1983   TONSILLECTOMY     WISDOM TOOTH EXTRACTION       No Known Allergies  Immunization History  Administered Date(s) Administered   Influenza,inj,Quad PF,6+ Mos 02/15/2019, 02/19/2020, 02/19/2021   Td 06/25/2009, 02/15/2019    Past Medical  History:  Diagnosis Date   Anemia    hx of   Arthritis    bilateral knees/hips   Hyperlipidemia    diet controlled   Seasonal allergies     Tobacco History: Social History   Tobacco Use  Smoking Status Former   Packs/day: 1.00   Years: 5.00   Additional pack years: 0.00   Total pack years: 5.00   Types: Cigarettes   Quit date: 45   Years since quitting: 31.2  Smokeless Tobacco Never   Counseling given: Not Answered   Outpatient Medications Prior to Visit  Medication Sig Dispense Refill   cetirizine (ZYRTEC) 10 MG tablet Take 10 mg by mouth daily.     Semaglutide-Weight Management (WEGOVY) 0.25 MG/0.5ML SOAJ Inject 0.25 mg into the skin once a week. 2 mL 1   triamcinolone cream (KENALOG) 0.1 % Apply 1 application topically 2 (two) times daily. 30 g 1   tirzepatide (ZEPBOUND) 2.5 MG/0.5ML Pen Inject 2.5 mg into the skin once a week. (Patient not taking: Reported on 06/23/2022) 3 mL 0   No facility-administered medications prior to visit.     Review of Systems:   Constitutional:   No  weight loss, night sweats,  Fevers, chills, fatigue, or  lassitude.  HEENT:  No headaches,  Difficulty swallowing,  Tooth/dental problems, or  Sore throat,                No sneezing, itching, ear ache, nasal congestion, post nasal drip,   CV:  No chest pain,  Orthopnea, PND, swelling in lower extremities, anasarca, dizziness, palpitations, syncope.   GI  No heartburn, indigestion, abdominal pain, nausea, vomiting, diarrhea, change in bowel habits, loss of appetite, bloody stools.   Resp: No shortness of breath with exertion or at rest.  No excess mucus, no productive cough,  No non-productive cough,  No coughing up of blood.  No change in color of mucus.  No wheezing.  No chest wall deformity  Skin: no rash or lesions.  GU: no dysuria, change in color of urine, no urgency or frequency.  No flank pain, no hematuria   MS:  No joint pain or swelling.  No decreased range of motion.   No back pain.    Physical Exam  BP 100/60 (BP Location: Left Arm, Patient Position: Sitting, Cuff Size: Large)   Pulse 78   Ht 5\' 7"  (1.702 m)   Wt 196 lb 3.2 oz (89 kg)   SpO2 97%   BMI 30.73 kg/m   GEN: A/Ox3; pleasant , NAD, well nourished    HEENT:  [place]/AT,  NOSE-clear, THROAT-clear, no lesions, no postnasal drip or exudate noted.  Class II-III MP airway  NECK:  Supple w/ fair ROM; no JVD; normal carotid impulses w/o bruits; no thyromegaly or nodules palpated; no lymphadenopathy.    RESP  Clear  P & A; w/o, wheezes/ rales/ or rhonchi. no accessory muscle use, no dullness to percussion  CARD:  RRR, no m/r/g, no peripheral edema, pulses intact, no cyanosis or clubbing.  GI:   Soft & nt; nml bowel sounds; no organomegaly or masses detected.   Musco: Warm bil, no deformities or joint swelling noted.   Neuro: alert, no focal deficits noted.    Skin: Warm, no lesions or rashes    Lab Results:   BMET   BNP No results found for: "BNP"  ProBNP No results found for: "PROBNP"  Imaging: No results found.        No data to display          No results found for: "NITRICOXIDE"      Assessment & Plan:   Snoring Snoring, restless sleep, daytime sleepiness all suspicious for underlying sleep apnea.  Will set patient up for home sleep study.  Patient education was given.  - discussed how weight can impact sleep and risk for sleep disordered breathing - discussed options to assist with weight loss: combination of diet modification, cardiovascular and strength training exercises   - had an extensive discussion regarding the adverse health consequences related to untreated sleep disordered breathing - specifically discussed the risks for hypertension, coronary artery disease, cardiac dysrhythmias, cerebrovascular disease, and diabetes - lifestyle modification discussed   - discussed how sleep disruption can increase risk of accidents, particularly when  driving - safe driving practices were discussed   Plan  Patient Instructions  Set up for home sleep study Healthy sleep regimen Work on healthy weight loss Do not drive if sleepy Follow-up in 6 weeks to discuss sleep study results and treatment plan      Rubye Oaks, NP 06/23/2022

## 2022-06-24 NOTE — Progress Notes (Signed)
Reviewed and agree with assessment/plan.   Coralyn Helling, MD [facility] Pulmonary/Critical Care 06/24/2022, 8:19 AM Pager:  [phone]

## 2022-06-30 ENCOUNTER — Telehealth: Payer: Self-pay | Admitting: Pharmacy Technician

## 2022-06-30 NOTE — Telephone Encounter (Signed)
Patient Advocate Encounter   Received notification that prior authorization for Wegovy 0.25MG /0.5ML auto-injectors is required.   PA submitted on 06/30/2022 [facility] Insurance [employer] Electronic Prior Authorization Form Status is pending       Roland Earl, CPhT Pharmacy Patient Advocate Specialist [facility] Patient Advocate Team Direct Number: [phone]  Fax: [phone]

## 2022-07-01 NOTE — Telephone Encounter (Signed)
PA Approved

## 2022-07-04 ENCOUNTER — Other Ambulatory Visit (HOSPITAL_COMMUNITY): Payer: Self-pay

## 2022-07-27 ENCOUNTER — Ambulatory Visit (INDEPENDENT_AMBULATORY_CARE_PROVIDER_SITE_OTHER): Payer: BC Managed Care – PPO

## 2022-07-27 ENCOUNTER — Encounter: Payer: Self-pay | Admitting: Adult Health

## 2022-07-27 ENCOUNTER — Ambulatory Visit: Payer: BC Managed Care – PPO | Admitting: Adult Health

## 2022-07-27 VITALS — BP 100/70 | HR 73 | Temp 98.2°F | Ht 67.0 in | Wt 187.0 lb

## 2022-07-27 DIAGNOSIS — M79671 Pain in right foot: Secondary | ICD-10-CM

## 2022-07-27 DIAGNOSIS — Z23 Encounter for immunization: Secondary | ICD-10-CM | POA: Diagnosis not present

## 2022-07-27 DIAGNOSIS — E669 Obesity, unspecified: Secondary | ICD-10-CM | POA: Diagnosis not present

## 2022-07-27 MED ORDER — WEGOVY 1 MG/0.5ML ~~LOC~~ SOAJ: 1.0000 mg | SUBCUTANEOUS | 0 refills | Status: DC

## 2022-07-27 NOTE — Progress Notes (Signed)
Subjective:    Patient ID: Judith Pitts, female    DOB: [birth year], 52 y.o.   MRN: [id number]  HPI 52 year old female who  has a past medical history of Anemia, Arthritis, Hyperlipidemia, and Seasonal allergies.  She is a patient of Dr. Caryl Never who I am seeing today for follow up regarding weight loss management.   She does exercise regularly and tries to eat a healthy diet.  She has tried weight watchers without much success.  She has also tried low calorie diets, keto diet, intermittent fasting without much success.  2 months ago she was started on Wegovy 0.25 mg weekly and then tolerated 2.5 mg a month later.  She reports that she has been doing well with this medication with no side effects. She continues to eat healthy and exercise.   Wt Readings from Last 3 Encounters:  07/27/22 187 lb (84.8 kg)  06/23/22 196 lb 3.2 oz (89 kg)  05/25/22 193 lb 9.6 oz (87.8 kg)   She also has an acute issue of pain on the ball of her right foot. Pain has been present for a few months. Pain is felt below the second toe. Often feels like she has a rock in her shoe. Pain is described as " sharp".  Denies injury. She has been using Ibuprfen which helps but pain comes back.   Review of Systems See HPI   Past Medical History:  Diagnosis Date   Anemia    hx of   Arthritis    bilateral knees/hips   Hyperlipidemia    diet controlled   Seasonal allergies     Social History   Socioeconomic History   Marital status: Single    Spouse name: Not on file   Number of children: Not on file   Years of education: Not on file   Highest education level: Not on file  Occupational History   Not on file  Tobacco Use   Smoking status: Former    Packs/day: 1.00    Years: 5.00    Additional pack years: 0.00    Total pack years: 5.00    Types: Cigarettes    Quit date: 27    Years since quitting: 31.3   Smokeless tobacco: Never  Vaping Use   Vaping Use: Never used  Substance and Sexual Activity    Alcohol use: Not Currently    Alcohol/week: 3.0 standard drinks of alcohol    Types: 3 Standard drinks or equivalent per week   Drug use: Not Currently    Types: Marijuana    Comment: patient said they used marijuana when they were 52 years old   Sexual activity: Yes  Other Topics Concern   Not on file  Social History Narrative   Not on file   Social Determinants of Health   Financial Resource Strain: Not on file  Food Insecurity: Not on file  Transportation Needs: Not on file  Physical Activity: Not on file  Stress: Not on file  Social Connections: Not on file  Intimate Partner Violence: Not on file    Past Surgical History:  Procedure Laterality Date   CESAREAN SECTION  1988   DILATION AND CURETTAGE OF UTERUS     x 2   EYE SURGERY  1983   TONSILLECTOMY     WISDOM TOOTH EXTRACTION      Family History  Problem Relation Age of Onset   Heart disease Mother    Hypertension Mother    Obesity Mother  Colon polyps Father    Heart disease Father    Hypertension Father    Obesity Father    Arthritis Father    Colon polyps Sister 50   Colon polyps Paternal Grandmother    Colitis Neg Hx    Colon cancer Neg Hx    Esophageal cancer Neg Hx    Stomach cancer Neg Hx    Rectal cancer Neg Hx     Allergies  Allergen Reactions   Lo Loestrin Fe [Norethin Ace-Eth Estrad-Fe] Hives    Pt reported     Current Outpatient Medications on File Prior to Visit  Medication Sig Dispense Refill   cetirizine (ZYRTEC) 10 MG tablet Take 10 mg by mouth daily.     triamcinolone cream (KENALOG) 0.1 % Apply 1 application topically 2 (two) times daily. 30 g 1   No current facility-administered medications on file prior to visit.    BP 100/70   Pulse 73   Temp 98.2 F (36.8 C) (Oral)   Ht 5\' 7"  (1.702 m)   Wt 187 lb (84.8 kg)   SpO2 97%   BMI 29.29 kg/m       Objective:   Physical Exam Vitals and nursing note reviewed.  Constitutional:      Appearance: Normal appearance.   Cardiovascular:     Rate and Rhythm: Normal rate and regular rhythm.     Pulses: Normal pulses.          Dorsalis pedis pulses are 2+ on the right side.       Posterior tibial pulses are 2+ on the right side.     Heart sounds: Normal heart sounds.  Pulmonary:     Breath sounds: Normal breath sounds.  Musculoskeletal:        General: Normal range of motion.     Right foot: Normal range of motion. No deformity.  Feet:     Right foot:     Skin integrity: Skin integrity normal.     Comments: Slight swelling of right foot pad.  Skin:    General: Skin is warm and dry.  Neurological:     General: No focal deficit present.     Mental Status: She is alert and oriented to person, place, and time.  Psychiatric:        Mood and Affect: Mood normal.        Behavior: Behavior normal.        Thought Content: Thought content normal.        Judgment: Judgment normal.        Assessment & Plan:  1. Class 1 obesity - She is done well with weight loss. We will increase her Wegovy to 1 mg.  - Follow up with PCP in 2 months  - Semaglutide-Weight Management (WEGOVY) 1 MG/0.5ML SOAJ; Inject 1 mg into the skin once a week.  Dispense: 6 mL; Refill: 0  2. Right foot pain - Will check xray for bony abnormality but likely morton's neuroma. Will have her take Nsaids for a week, ice, and can try gel insole. Consider referral to podiatry  - DG Foot Complete Right; Future  Shirline Frees, NP  Time spent with patient today was 33 minutes which consisted of chart review, discussing weight loss and foot pain, work up, treatment answering questions and documentation.

## 2022-07-29 ENCOUNTER — Encounter (INDEPENDENT_AMBULATORY_CARE_PROVIDER_SITE_OTHER): Payer: BC Managed Care – PPO

## 2022-07-29 DIAGNOSIS — G4733 Obstructive sleep apnea (adult) (pediatric): Secondary | ICD-10-CM | POA: Diagnosis not present

## 2022-07-29 DIAGNOSIS — R0683 Snoring: Secondary | ICD-10-CM

## 2022-08-09 ENCOUNTER — Ambulatory Visit: Payer: BC Managed Care – PPO | Admitting: Adult Health

## 2022-08-09 VITALS — BP 100/66 | HR 72

## 2022-08-09 DIAGNOSIS — G4733 Obstructive sleep apnea (adult) (pediatric): Secondary | ICD-10-CM | POA: Insufficient documentation

## 2022-08-09 NOTE — Progress Notes (Signed)
@Patient  ID: Judith Pitts, female    DOB: 1970-04-12, 52 y.o.   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  Patient was seen last visit for sleep consult for snoring and daytime sleepiness.  She was set up for home sleep study done on Jul 16, 2022 that showed moderate sleep apnea with AHI at 24.2/hour and SpO2 low at 84% we discussed her sleep study results in detail.  Went over treatment options including weight loss, oral appliance and CPAP therapy.  Patient like to proceed with CPAP therapy.  Patient education was given on CPAP care.  Patient says she will wear a small mask.  We went over various CPAP mask.  She would like to try the DreamWear nasal mask.  Allergies  Allergen Reactions   Lo Loestrin Fe [Norethin Ace-Eth Estrad-Fe] Hives    Pt reported     Immunization History  Administered Date(s) Administered   Influenza,inj,Quad PF,6+ Mos 02/15/2019, 02/19/2020, 02/19/2021   Td 06/25/2009, 02/15/2019   Zoster Recombinat (Shingrix) 07/27/2022    Past Medical History:  Diagnosis Date   Anemia    hx of   Arthritis    bilateral knees/hips   Hyperlipidemia    diet controlled   Seasonal allergies     Tobacco History: Social History   Tobacco Use  Smoking Status Former   Packs/day: 1.00   Years: 5.00   Additional pack years: 0.00   Total pack years: 5.00   Types: Cigarettes   Quit date: 36   Years since quitting: 31.4  Smokeless Tobacco Never   Counseling given: Not Answered   Outpatient Medications Prior to Visit  Medication Sig  Dispense Refill   cetirizine (ZYRTEC) 10 MG tablet Take 10 mg by mouth daily.     Semaglutide-Weight Management (WEGOVY) 1 MG/0.5ML SOAJ Inject 1 mg into the skin once a week. 6 mL 0   triamcinolone cream (KENALOG) 0.1 % Apply 1 application topically 2 (two) times daily. 30 g 1   No facility-administered medications prior to visit.     Review of Systems:   Constitutional:   No  weight loss, night sweats,  Fevers, chills, fatigue, or  lassitude.  HEENT:   No headaches,  Difficulty swallowing,  Tooth/dental problems, or  Sore throat,                No sneezing, itching, ear ache, nasal congestion, post nasal drip,   CV:  No chest pain,  Orthopnea, PND, swelling in lower extremities, anasarca, dizziness, palpitations, syncope.   GI  No heartburn, indigestion, abdominal pain, nausea, vomiting, diarrhea, change in bowel habits, loss of appetite, bloody stools.   Resp: No shortness of breath with exertion or at rest.  No excess mucus, no productive cough,  No non-productive cough,  No coughing up of blood.  No change in color of mucus.  No wheezing.  No chest wall deformity  Skin: no rash or lesions.  GU: no dysuria, change in color of urine, no urgency or frequency.  No flank pain, no hematuria  MS:  No joint pain or swelling.  No decreased range of motion.  No back pain.    Physical Exam  BP 100/66   Pulse 72   SpO2 100%   GEN: A/Ox3; pleasant , NAD, well nourished    HEENT:  [place]/AT,  EACs-clear, TMs-wnl, NOSE-clear, THROAT-clear, no lesions, no postnasal drip or exudate noted.   NECK:  Supple w/ fair ROM; no JVD; normal carotid impulses w/o bruits; no thyromegaly or nodules palpated; no lymphadenopathy.    RESP  Clear  P & A; w/o, wheezes/ rales/ or rhonchi. no accessory muscle use, no dullness to percussion  CARD:  RRR, no m/r/g, no peripheral edema, pulses intact, no cyanosis or clubbing.  GI:   Soft & nt; nml bowel sounds; no organomegaly or masses detected.   Musco:  Warm bil, no deformities or joint swelling noted.   Neuro: alert, no focal deficits noted.    Skin: Warm, no lesions or rashes    Lab Results:  CBC    Component Value Date/Time   WBC 4.3 05/25/2022 0915   RBC 4.46 05/25/2022 0915   HGB 14.6 05/25/2022 0915   HCT 42.6 05/25/2022 0915   PLT 290.0 05/25/2022 0915   MCV 95.4 05/25/2022 0915   MCHC 34.3 05/25/2022 0915   RDW 12.7 05/25/2022 0915   LYMPHSABS 1.7 05/25/2022 0915   MONOABS 0.4 05/25/2022 0915   EOSABS 0.1 05/25/2022 0915   BASOSABS 0.0 05/25/2022 0915    BMET    Component Value Date/Time   NA 139 05/25/2022 0915   K 4.2 05/25/2022 0915   CL 104 05/25/2022 0915   CO2 27 05/25/2022 0915   GLUCOSE 97 05/25/2022 0915   BUN 18 05/25/2022 0915   CREATININE 0.92 05/25/2022 0915   CALCIUM 9.4 05/25/2022 0915   GFRNONAA 85.26 06/18/2009 0748    BNP No results found for: "BNP"  ProBNP No results found for: "PROBNP"  Imaging: DG Foot Complete Right  Result Date: 07/31/2022 CLINICAL DATA:  Pain. EXAM: RIGHT FOOT COMPLETE - 3 VIEW COMPARISON:  None Available. FINDINGS: There is no evidence of fracture or dislocation. There is no evidence of arthropathy or other focal bone abnormality. Soft tissues are unremarkable. IMPRESSION: Negative. Electronically Signed   By: Layla Maw M.D.   On: 07/31/2022 09:02          No data to display          No results found for: "NITRICOXIDE"      Assessment & Plan:   OSA (obstructive sleep apnea) Moderate obstructive sleep apnea.  Patient occasion was given on sleep apnea.  Will proceed with CPAP therapy.  Begin auto CPAP 5 to 15 cm H2O.  Try the DreamWear nasal mask.  - discussed how weight can impact sleep and risk for sleep disordered breathing - discussed options to assist with weight loss: combination of diet modification, cardiovascular and strength training exercises   - had an extensive discussion regarding the adverse health consequences related to  untreated sleep disordered breathing - specifically discussed the risks for hypertension, coronary artery disease, cardiac dysrhythmias, cerebrovascular disease, and diabetes - lifestyle modification discussed   - discussed how sleep disruption can increase risk of accidents, particularly when driving - safe driving practices were discussed   Plan  Patient Instructions  Begin CPAP At bedtime wear all night long, for at least 6hr or more.  Try Dream wear nasal mask.  Saline nasal spray Twice daily  As needed   Saline nasal  gel At bedtime   Healthy sleep regimen Work on healthy weight loss Do not drive if sleepy Follow in 2-3 months and As needed        Rubye Oaks, NP 08/09/2022

## 2022-08-09 NOTE — Patient Instructions (Addendum)
Begin CPAP At bedtime wear all night long, for at least 6hr or more.  Try Dream wear nasal mask.  Saline nasal spray Twice daily  As needed   Saline nasal gel At bedtime   Healthy sleep regimen Work on healthy weight loss Do not drive if sleepy Follow in 2-3 months and As needed

## 2022-08-09 NOTE — Assessment & Plan Note (Signed)
Moderate obstructive sleep apnea.  Patient occasion was given on sleep apnea.  Will proceed with CPAP therapy.  Begin auto CPAP 5 to 15 cm H2O.  Try the DreamWear nasal mask.  - discussed how weight can impact sleep and risk for sleep disordered breathing - discussed options to assist with weight loss: combination of diet modification, cardiovascular and strength training exercises   - had an extensive discussion regarding the adverse health consequences related to untreated sleep disordered breathing - specifically discussed the risks for hypertension, coronary artery disease, cardiac dysrhythmias, cerebrovascular disease, and diabetes - lifestyle modification discussed   - discussed how sleep disruption can increase risk of accidents, particularly when driving - safe driving practices were discussed   Plan  Patient Instructions  Begin CPAP At bedtime wear all night long, for at least 6hr or more.  Try Dream wear nasal mask.  Saline nasal spray Twice daily  As needed   Saline nasal gel At bedtime   Healthy sleep regimen Work on healthy weight loss Do not drive if sleepy Follow in 2-3 months and As needed

## 2022-08-10 NOTE — Progress Notes (Signed)
Reviewed and agree with assessment/plan.   Doral Ventrella, MD [place] Pulmonary/Critical [facility] 08/10/2022, 8:11 AM Pager:  [phone]  

## 2022-08-18 DIAGNOSIS — Z1151 Encounter for screening for human papillomavirus (HPV): Secondary | ICD-10-CM | POA: Diagnosis not present

## 2022-08-18 DIAGNOSIS — Z01419 Encounter for gynecological examination (general) (routine) without abnormal findings: Secondary | ICD-10-CM | POA: Diagnosis not present

## 2022-08-18 DIAGNOSIS — Z6827 Body mass index (BMI) 27.0-27.9, adult: Secondary | ICD-10-CM | POA: Diagnosis not present

## 2022-08-18 DIAGNOSIS — Z124 Encounter for screening for malignant neoplasm of cervix: Secondary | ICD-10-CM | POA: Diagnosis not present

## 2022-08-18 DIAGNOSIS — Z1231 Encounter for screening mammogram for malignant neoplasm of breast: Secondary | ICD-10-CM | POA: Diagnosis not present

## 2022-08-31 DIAGNOSIS — G4733 Obstructive sleep apnea (adult) (pediatric): Secondary | ICD-10-CM | POA: Diagnosis not present

## 2022-09-13 DIAGNOSIS — G4733 Obstructive sleep apnea (adult) (pediatric): Secondary | ICD-10-CM | POA: Diagnosis not present

## 2022-09-24 ENCOUNTER — Telehealth: Payer: BC Managed Care – PPO | Admitting: Nurse Practitioner

## 2022-09-24 DIAGNOSIS — J0100 Acute maxillary sinusitis, unspecified: Secondary | ICD-10-CM | POA: Diagnosis not present

## 2022-09-24 MED ORDER — AMOXICILLIN-POT CLAVULANATE 875-125 MG PO TABS
1.0000 | ORAL_TABLET | Freq: Two times a day (BID) | ORAL | 0 refills | Status: AC
Start: 2022-09-24 — End: 2022-10-01

## 2022-09-24 MED ORDER — PROMETHAZINE-DM 6.25-15 MG/5ML PO SYRP
5.0000 mL | ORAL_SOLUTION | Freq: Four times a day (QID) | ORAL | 0 refills | Status: DC | PRN
Start: 2022-09-24 — End: 2022-11-18

## 2022-09-24 NOTE — Progress Notes (Signed)
Virtual Visit Consent   Judith Pitts, you are scheduled for a virtual visit with a [facility] provider today. Just as with appointments in the office, your consent must be obtained to participate. Your consent will be active for this visit and any virtual visit you may have with one of our providers in the next 365 days. If you have a MyChart account, a copy of this consent can be sent to you electronically.  As this is a virtual visit, video technology does not allow for your provider to perform a traditional examination. This may limit your provider's ability to fully assess your condition. If your provider identifies any concerns that need to be evaluated in person or the need to arrange testing (such as labs, EKG, etc.), we will make arrangements to do so. Although advances in technology are sophisticated, we cannot ensure that it will always work on either your end or our end. If the connection with a video visit is poor, the visit may have to be switched to a telephone visit. With either a video or telephone visit, we are not always able to ensure that we have a secure connection.  By engaging in this virtual visit, you consent to the provision of healthcare and authorize for your insurance to be billed (if applicable) for the services provided during this visit. Depending on your insurance coverage, you may receive a charge related to this service.  I need to obtain your verbal consent now. Are you willing to proceed with your visit today? Elmo Putt has provided verbal consent on 09/24/2022 for a virtual visit (video or telephone). Claiborne Rigg, NP  Date: 09/24/2022 11:13 AM  Virtual Visit via Video Note   I, Claiborne Rigg, connected with  Judith Pitts  ([id number], November 08, 1970) on 09/24/22 at 10:00 AM EDT by a video-enabled telemedicine application and verified that I am speaking with the correct person using two identifiers.  Location: Patient: Virtual Visit Location Patient:  Home Provider: Virtual Visit Location Provider: Home Office   I discussed the limitations of evaluation and management by telemedicine and the availability of in person appointments. The patient expressed understanding and agreed to proceed.    History of Present Illness: Judith Pitts is a 52 y.o. who identifies as a female who was assigned female at birth, and is being seen today for bacterial sinusitis.  Sinus Pain: Patient complains of achiness, congestion, cough described as productive of brown and mucoid sputum, facial pain, fever , headache,, nasal congestion, post nasal drip, purulent nasal discharge, sinus pressure, sore throat and laryngitis. Onset of symptoms was several days ago, gradually worsening since that time.  Past history is significant for  SINUSITIS . Patient is a former smoker. Tmax 101.       Problems:  Patient Active Problem List   Diagnosis Date Noted   OSA (obstructive sleep apnea) 08/09/2022   Snoring 06/23/2022   Abnormal cervical Papanicolaou smear 03/13/2019   Anemia 03/13/2019   Arthritis 03/13/2019   DERMATITIS 12/14/2009   HYPOTHYROIDISM, BORDERLINE 06/01/2009   DEPRESSION, MAJOR, HX OF 06/01/2009   ANEMIA, HX OF 06/01/2009    Allergies:  Allergies  Allergen Reactions   Lo Loestrin Fe [Norethin Ace-Eth Estrad-Fe] Hives    Pt reported    Medications:  Current Outpatient Medications:    amoxicillin-clavulanate (AUGMENTIN) 875-125 MG tablet, Take 1 tablet by mouth 2 (two) times daily for 7 days., Disp: 14 tablet, Rfl: 0   promethazine-dextromethorphan (PROMETHAZINE-DM) 6.25-15 MG/5ML  syrup, Take 5 mLs by mouth 4 (four) times daily as needed for cough., Disp: 240 mL, Rfl: 0   cetirizine (ZYRTEC) 10 MG tablet, Take 10 mg by mouth daily., Disp: , Rfl:    Semaglutide-Weight Management (WEGOVY) 1 MG/0.5ML SOAJ, Inject 1 mg into the skin once a week., Disp: 6 mL, Rfl: 0   triamcinolone cream (KENALOG) 0.1 %, Apply 1 application topically 2 (two) times  daily., Disp: 30 g, Rfl: 1  Observations/Objective: Patient is well-developed, well-nourished in no acute distress.  Resting comfortably at home.  Head is normocephalic, atraumatic.  No labored breathing.  Speech is clear and coherent with logical content. Vocal hoarseness is present Patient is alert and oriented at baseline.    Assessment and Plan: 1. Acute non-recurrent maxillary sinusitis - promethazine-dextromethorphan (PROMETHAZINE-DM) 6.25-15 MG/5ML syrup; Take 5 mLs by mouth 4 (four) times daily as needed for cough.  Dispense: 240 mL; Refill: 0 - amoxicillin-clavulanate (AUGMENTIN) 875-125 MG tablet; Take 1 tablet by mouth 2 (two) times daily for 7 days.  Dispense: 14 tablet; Refill: 0   Follow Up Instructions: I discussed the assessment and treatment plan with the patient. The patient was provided an opportunity to ask questions and all were answered. The patient agreed with the plan and demonstrated an understanding of the instructions.  A copy of instructions were sent to the patient via MyChart unless otherwise noted below.    The patient was advised to call back or seek an in-person evaluation if the symptoms worsen or if the condition fails to improve as anticipated.  Time:  I spent 11 minutes with the patient via telehealth technology discussing the above problems/concerns.    Claiborne Rigg, NP

## 2022-09-24 NOTE — Patient Instructions (Signed)
  Judith Pitts, thank you for joining Claiborne Rigg, NP for today's virtual visit.  While this provider is not your primary care provider (PCP), if your PCP is located in our provider database this encounter information will be shared with them immediately following your visit.   A [facility] MyChart account gives you access to today's visit and all your visits, tests, and labs performed at [facility] " click here if you don't have a [facility] MyChart account or go to [email or web address]  Consent: (Patient) Judith Pitts provided verbal consent for this virtual visit at the beginning of the encounter.  Current Medications:  Current Outpatient Medications:    amoxicillin-clavulanate (AUGMENTIN) 875-125 MG tablet, Take 1 tablet by mouth 2 (two) times daily for 7 days., Disp: 14 tablet, Rfl: 0   promethazine-dextromethorphan (PROMETHAZINE-DM) 6.25-15 MG/5ML syrup, Take 5 mLs by mouth 4 (four) times daily as needed for cough., Disp: 240 mL, Rfl: 0   cetirizine (ZYRTEC) 10 MG tablet, Take 10 mg by mouth daily., Disp: , Rfl:    Semaglutide-Weight Management (WEGOVY) 1 MG/0.5ML SOAJ, Inject 1 mg into the skin once a week., Disp: 6 mL, Rfl: 0   triamcinolone cream (KENALOG) 0.1 %, Apply 1 application topically 2 (two) times daily., Disp: 30 g, Rfl: 1   Medications ordered in this encounter:  Meds ordered this encounter  Medications   promethazine-dextromethorphan (PROMETHAZINE-DM) 6.25-15 MG/5ML syrup    Sig: Take 5 mLs by mouth 4 (four) times daily as needed for cough.    Dispense:  240 mL    Refill:  0    Order Specific Question:   Supervising Provider    Answer:   Merrilee Jansky [id number]   amoxicillin-clavulanate (AUGMENTIN) 875-125 MG tablet    Sig: Take 1 tablet by mouth 2 (two) times daily for 7 days.    Dispense:  14 tablet    Refill:  0    Order Specific Question:   Supervising Provider    Answer:   Merrilee Jansky [id number]     *If you need refills  on other medications prior to your next appointment, please contact your pharmacy*  Follow-Up: Call back or seek an in-person evaluation if the symptoms worsen or if the condition fails to improve as anticipated.  [facility] [phone]  Other Instructions INSTRUCTIONS: use a humidifier for nasal congestion Drink plenty of fluids, rest and wash hands frequently to avoid the spread of infection Alternate tylenol and Motrin for relief of fever    If you have been instructed to have an in-person evaluation today at a local Urgent Care facility, please use the link below. It will take you to a list of all of our available [facility], including address, phone number and hours of operation. Please do not delay care.  [facility]  If you or a family member do not have a primary care provider, use the link below to schedule a visit and establish care. When you choose a [facility] primary care physician or advanced practice provider, you gain a long-term partner in health. Find a Primary Care Provider  Learn more about [facility]'s in-office and virtual care options:  - Get Care Now

## 2022-09-26 ENCOUNTER — Telehealth: Payer: Self-pay

## 2022-09-26 ENCOUNTER — Encounter: Payer: Self-pay | Admitting: Family Medicine

## 2022-09-26 ENCOUNTER — Ambulatory Visit: Payer: BC Managed Care – PPO | Admitting: Family Medicine

## 2022-09-26 VITALS — BP 106/70 | HR 80 | Temp 98.5°F | Ht 67.0 in | Wt 177.0 lb

## 2022-09-26 DIAGNOSIS — R748 Abnormal levels of other serum enzymes: Secondary | ICD-10-CM | POA: Diagnosis not present

## 2022-09-26 DIAGNOSIS — E663 Overweight: Secondary | ICD-10-CM

## 2022-09-26 DIAGNOSIS — Z6827 Body mass index (BMI) 27.0-27.9, adult: Secondary | ICD-10-CM | POA: Diagnosis not present

## 2022-09-26 LAB — HEPATIC FUNCTION PANEL
ALT: 16 U/L (ref 0–35)
AST: 17 U/L (ref 0–37)
Albumin: 4.3 g/dL (ref 3.5–5.2)
Alkaline Phosphatase: 61 U/L (ref 39–117)
Bilirubin, Direct: 0.1 mg/dL (ref 0.0–0.3)
Total Bilirubin: 0.4 mg/dL (ref 0.2–1.2)
Total Protein: 7.6 g/dL (ref 6.0–8.3)

## 2022-09-26 MED ORDER — WEGOVY 1.7 MG/0.75ML ~~LOC~~ SOAJ: 1.7000 mg | SUBCUTANEOUS | 11 refills | Status: DC

## 2022-09-26 NOTE — Telephone Encounter (Signed)
*  Primary  PA request received for Wegovy 1.7MG /0.75ML auto-injectors  PA submitted to [employer] via CMM and is pending additional questions/determination  Key: [id number]

## 2022-09-26 NOTE — Progress Notes (Signed)
Established Patient Office Visit  Subjective   Patient ID: Judith Pitts, female    DOB: November 18, 1970  Age: 52 y.o. MRN: [id number]  Chief Complaint  Patient presents with   Medical Management of Chronic Issues    HPI   Judith Pitts seen today for medical follow-up.  We had initiated [facility] back in March.  She initially had some difficulty getting this covered.  She is tolerating well.  Currently on 1 mg subcutaneous once weekly.  Her weight is down about 16 to 17 pounds from onset and she states was even lower until some recent poor compliance with diet.  She is also not been walking as much during the past week.  She would like to get her weight eventually down to 160 pounds.  She felt her best around this weight.  She has no nausea.  She has history of obstructive sleep apnea and hopes weight loss will improve that as well  She had elevated liver transaminase back in March with ALT elevation which was mild but normal AST.  Suspected fatty liver changes.  Past Medical History:  Diagnosis Date   Anemia    hx of   Arthritis    bilateral knees/hips   Hyperlipidemia    diet controlled   Seasonal allergies    Past Surgical History:  Procedure Laterality Date   CESAREAN SECTION  1988   DILATION AND CURETTAGE OF UTERUS     x 2   EYE SURGERY  1983   TONSILLECTOMY     WISDOM TOOTH EXTRACTION      reports that she quit smoking about 31 years ago. Her smoking use included cigarettes. She started smoking about 36 years ago. She has a 5 pack-year smoking history. She has never used smokeless tobacco. She reports that she does not currently use alcohol after a past usage of about 3.0 standard drinks of alcohol per week. She reports that she does not currently use drugs after having used the following drugs: Marijuana. family history includes Arthritis in her father; Colon polyps in her father and paternal grandmother; Colon polyps (age of onset: 22) in her sister; Heart disease in her father and  mother; Hypertension in her father and mother; Obesity in her father and mother. Allergies  Allergen Reactions   Lo Loestrin Fe [Norethin Ace-Eth Estrad-Fe] Hives    Pt reported     Review of Systems  Constitutional:  Negative for malaise/fatigue.  Eyes:  Negative for blurred vision.  Respiratory:  Negative for shortness of breath.   Cardiovascular:  Negative for chest pain.  Neurological:  Negative for dizziness, weakness and headaches.      Objective:     BP 106/70 (BP Location: Left Arm, Patient Position: Sitting, Cuff Size: Large)   Pulse 80   Temp 98.5 F (36.9 C) (Oral)   Ht 5\' 7"  (1.702 m)   Wt 177 lb (80.3 kg)   SpO2 98%   BMI 27.72 kg/m  BP Readings from Last 3 Encounters:  09/26/22 106/70  08/09/22 100/66  07/27/22 100/70   Wt Readings from Last 3 Encounters:  09/26/22 177 lb (80.3 kg)  07/27/22 187 lb (84.8 kg)  06/23/22 196 lb 3.2 oz (89 kg)      Physical Exam Vitals reviewed.  Constitutional:      Appearance: She is well-developed.  Neck:     Thyroid: No thyromegaly.     Vascular: No JVD.  Cardiovascular:     Rate and Rhythm: Normal rate and regular  rhythm.     Heart sounds:     No gallop.  Pulmonary:     Effort: Pulmonary effort is normal. No respiratory distress.     Breath sounds: Normal breath sounds. No wheezing or rales.  Musculoskeletal:     Cervical back: Neck supple.  Neurological:     Mental Status: She is alert.      No results found for any visits on 09/26/22.  Last metabolic panel Lab Results  Component Value Date   GLUCOSE 97 05/25/2022   NA 139 05/25/2022   K 4.2 05/25/2022   CL 104 05/25/2022   CO2 27 05/25/2022   BUN 18 05/25/2022   CREATININE 0.92 05/25/2022   GFR 72.32 05/25/2022   CALCIUM 9.4 05/25/2022   PROT 7.4 05/25/2022   ALBUMIN 4.3 05/25/2022   BILITOT 0.6 05/25/2022   ALKPHOS 59 05/25/2022   AST 26 05/25/2022   ALT 41 (H) 05/25/2022   Last lipids Lab Results  Component Value Date   CHOL 222  (H) 05/25/2022   HDL 69.60 05/25/2022   LDLCALC 136 (H) 05/25/2022   TRIG 83.0 05/25/2022   CHOLHDL 3 05/25/2022      The 16-XWRU ASCVD risk score (Arnett DK, et al., 2019) is: 0.8%    Assessment & Plan:   Problem List Items Addressed This Visit   None Visit Diagnoses     Abnormal transaminases    -  Primary   Relevant Orders   Hepatic function panel   Overweight (BMI 25.0-29.9)         Patient is losing weight on Wegovy and tolerating well currently at 1 mg once weekly.  She will like to titrate further to 1.7 mg.  We discussed importance of regular exercise and dietary compliance.  She is doing some swimming in addition to walking for exercise most days of the week.  Recheck liver panel today and suspect liver transaminases will be improving with her weight loss.  Set up 68-month follow-up to reassess  Return in about 3 months (around 12/27/2022).    Evelena Peat, MD

## 2022-09-30 DIAGNOSIS — G4733 Obstructive sleep apnea (adult) (pediatric): Secondary | ICD-10-CM | POA: Diagnosis not present

## 2022-09-30 NOTE — Telephone Encounter (Signed)
This medication or product was previously approved on [id number] from 06/30/2022 to 01/30/2023.

## 2022-10-31 ENCOUNTER — Ambulatory Visit: Payer: BC Managed Care – PPO | Admitting: Adult Health

## 2022-10-31 DIAGNOSIS — G4733 Obstructive sleep apnea (adult) (pediatric): Secondary | ICD-10-CM | POA: Diagnosis not present

## 2022-11-18 ENCOUNTER — Encounter: Payer: Self-pay | Admitting: Adult Health

## 2022-11-18 ENCOUNTER — Ambulatory Visit (INDEPENDENT_AMBULATORY_CARE_PROVIDER_SITE_OTHER): Payer: BC Managed Care – PPO | Admitting: Adult Health

## 2022-11-18 VITALS — BP 100/74 | HR 80 | Ht 69.0 in | Wt 179.0 lb

## 2022-11-18 DIAGNOSIS — G4733 Obstructive sleep apnea (adult) (pediatric): Secondary | ICD-10-CM

## 2022-11-18 DIAGNOSIS — Z23 Encounter for immunization: Secondary | ICD-10-CM

## 2022-11-18 NOTE — Assessment & Plan Note (Signed)
Moderate obstructive sleep apnea with excellent control on CPAP.  Patient is encouraged on daily usage.  If machine continues to turn off and on during the night.  Of asked her to take her machine into the DME for a evaluation  Plan  Patient Instructions  Continue on CPAP At bedtime wear all night long, for at least 6hr or more.  Saline nasal spray Twice daily  As needed   Saline nasal gel At bedtime   Healthy sleep regimen Work on healthy weight loss Do not drive if sleepy Follow in 6 month and As needed

## 2022-11-18 NOTE — Progress Notes (Signed)
@Patient  ID: Judith Pitts, female    DOB: Jul 29, 1970, 52 y.o.   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  Patient is followed for moderate obstructive sleep apnea.  She was started on CPAP therapy last visit.  Since last visit patient she was to get used to her CPAP.  Does feel that she is benefiting from it she has definitely noticed an improvement in her daytime energy.  She does not feel as sleepy.  She is currently using the DreamWear nasal mask.  Says that sometimes her machine turns off in the middle the night.  A CPAP download shows 83% compliance.  Daily average usage at 5 hours.  Patient is on auto CPAP 5 to 15 cm H2O.  AHI 0.8/hour, mean pressure at 7.4 cm H2O. Does have hot flashes interrupts sleep on occasion.   Allergies  Allergen Reactions   Lo Loestrin Fe [Norethin Ace-Eth Estrad-Fe] Hives    Pt reported     Immunization History  Administered Date(s) Administered   Influenza,inj,Quad PF,6+ Mos 02/15/2019, 02/19/2020, 02/19/2021   Td 06/25/2009, 02/15/2019   Zoster Recombinant(Shingrix) 07/27/2022    Past Medical History:  Diagnosis Date   Anemia    hx of   Arthritis    bilateral knees/hips   Hyperlipidemia    diet controlled   Seasonal allergies     Tobacco History: Social History   Tobacco Use  Smoking Status Former   Current packs/day: 0.00   Average packs/day: 1 pack/day for 5.0 years (5.0 ttl pk-yrs)   Types: Cigarettes   Start date: 40   Quit date: 1993   Years since quitting: 31.7  Smokeless Tobacco Never    Counseling given: Not Answered   Outpatient Medications Prior to Visit  Medication Sig Dispense Refill   cetirizine (ZYRTEC) 10 MG tablet Take 10 mg by mouth daily.     Semaglutide-Weight Management (WEGOVY) 1.7 MG/0.75ML SOAJ Inject 1.7 mg into the skin once a week. 3 mL 11   triamcinolone cream (KENALOG) 0.1 % Apply 1 application topically 2 (two) times daily. 30 g 1   promethazine-dextromethorphan (PROMETHAZINE-DM) 6.25-15 MG/5ML syrup Take 5 mLs by mouth 4 (four) times daily as needed for cough. (Patient not taking: Reported on 11/18/2022) 240 mL 0   No facility-administered medications prior to visit.     Review of Systems:   Constitutional:   No  weight loss, night sweats,  Fevers, chills, +fatigue, or  lassitude.  HEENT:   No headaches,  Difficulty swallowing,  Tooth/dental problems, or  Sore throat,                No sneezing, itching, ear ache, nasal congestion, post nasal drip,   CV:  No chest pain,  Orthopnea, PND, swelling in lower extremities, anasarca, dizziness, palpitations, syncope.   GI  No heartburn, indigestion, abdominal pain, nausea, vomiting, diarrhea, change in bowel habits, loss of appetite, bloody stools.   Resp: No shortness of breath with exertion or at rest.  No excess mucus, no productive cough,  No non-productive cough,  No coughing up of blood.  No change in color of mucus.  No wheezing.  No chest wall deformity  Skin: no rash or lesions.  GU: no dysuria, change in color of urine, no urgency or frequency.  No flank pain, no hematuria   MS:  No joint pain or swelling.  No decreased range of motion.  No back pain.    Physical Exam  BP 100/74 (BP Location: Left Arm, Patient Position: Sitting, Cuff Size: Normal)   Pulse 80   Ht 5\' 9"  (1.753 m)   Wt 179 lb (81.2 kg)   SpO2 98%   BMI 26.43 kg/m   GEN: A/Ox3; pleasant , NAD, well nourished    HEENT:  [place]/AT,   NOSE-clear, THROAT-clear, no lesions, no postnasal drip or exudate noted.   NECK:   Supple w/ fair ROM; no JVD; normal carotid impulses w/o bruits; no thyromegaly or nodules palpated; no lymphadenopathy.    RESP  Clear  P & A; w/o, wheezes/ rales/ or rhonchi. no accessory muscle use, no dullness to percussion  CARD:  RRR, no m/r/g, no peripheral edema, pulses intact, no cyanosis or clubbing.  GI:   Soft & nt; nml bowel sounds; no organomegaly or masses detected.   Musco: Warm bil, no deformities or joint swelling noted.   Neuro: alert, no focal deficits noted.    Skin: Warm, no lesions or rashes    Lab Results:  CBC    Component Value Date/Time   WBC 4.3 05/25/2022 0915   RBC 4.46 05/25/2022 0915   HGB 14.6 05/25/2022 0915   HCT 42.6 05/25/2022 0915   PLT 290.0 05/25/2022 0915   MCV 95.4 05/25/2022 0915   MCHC 34.3 05/25/2022 0915   RDW 12.7 05/25/2022 0915   LYMPHSABS 1.7 05/25/2022 0915   MONOABS 0.4 05/25/2022 0915   EOSABS 0.1 05/25/2022 0915   BASOSABS 0.0 05/25/2022 0915    BMET    Component Value Date/Time   NA 139 05/25/2022 0915   K 4.2 05/25/2022 0915   CL 104 05/25/2022 0915   CO2 27 05/25/2022 0915   GLUCOSE 97 05/25/2022 0915   BUN 18 05/25/2022 0915   CREATININE 0.92 05/25/2022 0915   CALCIUM 9.4 05/25/2022 0915   GFRNONAA 85.26 06/18/2009 0748    BNP No results found for: "BNP"  ProBNP No results found for: "PROBNP"  Imaging: No results found.  Administration History     None           No data to display          No results found for: "NITRICOXIDE"      Assessment & Plan:   OSA (obstructive sleep apnea) Moderate obstructive sleep apnea with excellent control on CPAP.  Patient is encouraged on daily usage.  If machine continues to turn off and on during the night.  Of asked her to take her machine into the DME for a evaluation  Plan  Patient Instructions  Continue on CPAP At bedtime wear all night long, for at least 6hr or more.  Saline nasal spray Twice daily  As needed   Saline nasal gel At  bedtime   Healthy sleep regimen Work on healthy weight loss Do not drive if sleepy Follow in 6 month and As needed        Rubye Oaks, NP 11/18/2022

## 2022-11-18 NOTE — Patient Instructions (Addendum)
Continue on CPAP At bedtime wear all night long, for at least 6hr or more.  Saline nasal spray Twice daily  As needed   Saline nasal gel At bedtime   Healthy sleep regimen Work on healthy weight loss Do not drive if sleepy Follow in 6 month and As needed

## 2022-11-18 NOTE — Progress Notes (Signed)
Reviewed and agree with assessment/plan.   Coralyn Helling, MD [facility] 11/18/2022, 2:37 PM Pager:  [phone]

## 2022-12-01 ENCOUNTER — Ambulatory Visit: Payer: BC Managed Care – PPO | Admitting: Adult Health

## 2022-12-01 DIAGNOSIS — G4733 Obstructive sleep apnea (adult) (pediatric): Secondary | ICD-10-CM | POA: Diagnosis not present

## 2022-12-27 ENCOUNTER — Encounter: Payer: Self-pay | Admitting: Family Medicine

## 2022-12-27 ENCOUNTER — Ambulatory Visit: Payer: BC Managed Care – PPO | Admitting: Family Medicine

## 2022-12-27 VITALS — BP 100/70 | HR 68 | Temp 98.2°F | Ht 69.0 in | Wt 172.7 lb

## 2022-12-27 DIAGNOSIS — E785 Hyperlipidemia, unspecified: Secondary | ICD-10-CM | POA: Diagnosis not present

## 2022-12-27 DIAGNOSIS — Z23 Encounter for immunization: Secondary | ICD-10-CM | POA: Diagnosis not present

## 2022-12-27 DIAGNOSIS — Z713 Dietary counseling and surveillance: Secondary | ICD-10-CM

## 2022-12-27 MED ORDER — WEGOVY 2.4 MG/0.75ML ~~LOC~~ SOAJ: 2.4000 mg | SUBCUTANEOUS | 5 refills | Status: DC

## 2022-12-27 NOTE — Addendum Note (Signed)
Addended by: Christy Sartorius on: 12/27/2022 08:35 AM   Modules accepted: Orders

## 2022-12-27 NOTE — Progress Notes (Signed)
Established Patient Office Visit  Subjective   Patient ID: Judith Pitts, female    DOB: 08-22-1970  Age: 52 y.o. MRN: [id number]  Chief Complaint  Patient presents with   Medical Management of Chronic Issues    HPI   Judith Pitts is seen today to follow-up regarding weight loss management.  She is currently on Wegovy 1.7 mg subcutaneous once weekly.  She describes a "prickly skin" sensation usually worse couple days after injection and then gradually improves through the week.  No rash.  Has some early satiety, as expected.  No other side effects.  No nausea.  Her weight last March was 193 pounds and her top weight was around 196.  She is currently down to 172 today.  She also had mild ALT elevation likely related to fatty liver and repeat liver functions few months ago and returned to normal.  Feels better overall.  Still exercising regularly.  She would like to get target weight of 160 pounds.  Has no history of diabetes.  She does have history of mild hyperlipidemia but overall low risk for CAD.  Past Medical History:  Diagnosis Date   Anemia    hx of   Arthritis    bilateral knees/hips   Hyperlipidemia    diet controlled   Seasonal allergies    Past Surgical History:  Procedure Laterality Date   CESAREAN SECTION  1988   DILATION AND CURETTAGE OF UTERUS     x 2   EYE SURGERY  1983   TONSILLECTOMY     WISDOM TOOTH EXTRACTION      reports that she quit smoking about 31 years ago. Her smoking use included cigarettes. She started smoking about 36 years ago. She has a 5 pack-year smoking history. She has never used smokeless tobacco. She reports that she does not currently use alcohol after a past usage of about 3.0 standard drinks of alcohol per week. She reports that she does not currently use drugs after having used the following drugs: Marijuana. family history includes Arthritis in her father; Colon polyps in her father and paternal grandmother; Colon polyps (age of onset: 60) in  her sister; Heart disease in her father and mother; Hypertension in her father and mother; Obesity in her father and mother. Allergies  Allergen Reactions   Lo Loestrin Fe [Norethin Ace-Eth Estrad-Fe] Hives    Pt reported     Review of Systems  Constitutional:  Positive for weight loss.  Gastrointestinal:  Positive for constipation. Negative for abdominal pain, diarrhea, nausea and vomiting.      Objective:     BP 100/70 (BP Location: Left Arm, Patient Position: Sitting, Cuff Size: Normal)   Pulse 68   Temp 98.2 F (36.8 C) (Oral)   Ht 5\' 9"  (1.753 m)   Wt 172 lb 11.2 oz (78.3 kg)   SpO2 98%   BMI 25.50 kg/m  BP Readings from Last 3 Encounters:  12/27/22 100/70  11/18/22 100/74  09/26/22 106/70   Wt Readings from Last 3 Encounters:  12/27/22 172 lb 11.2 oz (78.3 kg)  11/18/22 179 lb (81.2 kg)  09/26/22 177 lb (80.3 kg)      Physical Exam Vitals reviewed.  Constitutional:      Appearance: Normal appearance.  Cardiovascular:     Rate and Rhythm: Normal rate and regular rhythm.  Pulmonary:     Effort: Pulmonary effort is normal.     Breath sounds: Normal breath sounds.  Neurological:     Mental  Status: She is alert.      No results found for any visits on 12/27/22.  Last CBC Lab Results  Component Value Date   WBC 4.3 05/25/2022   HGB 14.6 05/25/2022   HCT 42.6 05/25/2022   MCV 95.4 05/25/2022   RDW 12.7 05/25/2022   PLT 290.0 05/25/2022   Last metabolic panel Lab Results  Component Value Date   GLUCOSE 97 05/25/2022   NA 139 05/25/2022   K 4.2 05/25/2022   CL 104 05/25/2022   CO2 27 05/25/2022   BUN 18 05/25/2022   CREATININE 0.92 05/25/2022   GFR 72.32 05/25/2022   CALCIUM 9.4 05/25/2022   PROT 7.6 09/26/2022   ALBUMIN 4.3 09/26/2022   BILITOT 0.4 09/26/2022   ALKPHOS 61 09/26/2022   AST 17 09/26/2022   ALT 16 09/26/2022   Last lipids Lab Results  Component Value Date   CHOL 222 (H) 05/25/2022   HDL 69.60 05/25/2022   LDLCALC 136  (H) 05/25/2022   TRIG 83.0 05/25/2022   CHOLHDL 3 05/25/2022   Last hemoglobin A1c No results found for: "HGBA1C" Last thyroid functions Lab Results  Component Value Date   TSH 1.91 02/19/2021      The 10-year ASCVD risk score (Arnett DK, et al., 2019) is: 0.7%    Assessment & Plan:   History of obesity.  She is having good success with weight loss with ZOXWRU.  Tolerating well except for a "prickly skin "sensation.  She would like to titrate [facility] dosage up to 2.4 mg and new prescription sent.  She has scheduled follow-up for physical in March.  Continue regular exercise habits.  Patient also requesting second shingles vaccine today.  She tolerated first without difficulty.  Evelena Peat, MD

## 2022-12-31 DIAGNOSIS — G4733 Obstructive sleep apnea (adult) (pediatric): Secondary | ICD-10-CM | POA: Diagnosis not present

## 2023-01-31 DIAGNOSIS — G4733 Obstructive sleep apnea (adult) (pediatric): Secondary | ICD-10-CM | POA: Diagnosis not present

## 2023-03-02 DIAGNOSIS — G4733 Obstructive sleep apnea (adult) (pediatric): Secondary | ICD-10-CM | POA: Diagnosis not present

## 2023-04-02 DIAGNOSIS — G4733 Obstructive sleep apnea (adult) (pediatric): Secondary | ICD-10-CM | POA: Diagnosis not present

## 2023-04-21 ENCOUNTER — Telehealth: Payer: BC Managed Care – PPO | Admitting: Physician Assistant

## 2023-04-21 DIAGNOSIS — J019 Acute sinusitis, unspecified: Secondary | ICD-10-CM | POA: Diagnosis not present

## 2023-04-21 DIAGNOSIS — B9689 Other specified bacterial agents as the cause of diseases classified elsewhere: Secondary | ICD-10-CM

## 2023-04-21 MED ORDER — AMOXICILLIN-POT CLAVULANATE 875-125 MG PO TABS: 1.0000 | ORAL_TABLET | Freq: Two times a day (BID) | ORAL | 0 refills | Status: DC

## 2023-04-21 NOTE — Patient Instructions (Signed)
 Chinenye D Viles, thank you for joining Delon CHRISTELLA Dickinson, PA-C for today's virtual visit.  While this provider is not your primary care provider (PCP), if your PCP is located in our provider database this encounter information will be shared with them immediately following your visit.   A [facility] MyChart account gives you access to today's visit and all your visits, tests, and labs performed at [facility]  click here if you don't have a [facility] MyChart account or go to [email or web address]  Consent: (Patient) Judith Pitts provided verbal consent for this virtual visit at the beginning of the encounter.  Current Medications:  Current Outpatient Medications:    amoxicillin -clavulanate (AUGMENTIN ) 875-125 MG tablet, Take 1 tablet by mouth 2 (two) times daily., Disp: 14 tablet, Rfl: 0   cetirizine (ZYRTEC) 10 MG tablet, Take 10 mg by mouth daily., Disp: , Rfl:    Semaglutide -Weight Management (WEGOVY ) 2.4 MG/0.75ML SOAJ, Inject 2.4 mg into the skin once a week., Disp: 3 mL, Rfl: 5   triamcinolone  cream (KENALOG ) 0.1 %, Apply 1 application topically 2 (two) times daily., Disp: 30 g, Rfl: 1   Medications ordered in this encounter:  Meds ordered this encounter  Medications   amoxicillin -clavulanate (AUGMENTIN ) 875-125 MG tablet    Sig: Take 1 tablet by mouth 2 (two) times daily.    Dispense:  14 tablet    Refill:  0    Supervising Provider:   BLAISE ALEENE KIDD [[id number]]     *If you need refills on other medications prior to your next appointment, please contact your pharmacy*  Follow-Up: Call back or seek an in-person evaluation if the symptoms worsen or if the condition fails to improve as anticipated.  [facility] [phone]  Other Instructions  Sinus Infection, Adult A sinus infection, also called sinusitis, is inflammation of your sinuses. Sinuses are hollow spaces in the bones around your face. Your sinuses are located: Around your  eyes. In the middle of your forehead. Behind your nose. In your cheekbones. Mucus normally drains out of your sinuses. When your nasal tissues become inflamed or swollen, mucus can become trapped or blocked. This allows bacteria, viruses, and fungi to grow, which leads to infection. Most infections of the sinuses are caused by a virus. A sinus infection can develop quickly. It can last for up to 4 weeks (acute) or for more than 12 weeks (chronic). A sinus infection often develops after a cold. What are the causes? This condition is caused by anything that creates swelling in the sinuses or stops mucus from draining. This includes: Allergies. Asthma. Infection from bacteria or viruses. Deformities or blockages in your nose or sinuses. Abnormal growths in the nose (nasal polyps). Pollutants, such as chemicals or irritants in the air. Infection from fungi. This is rare. What increases the risk? You are more likely to develop this condition if you: Have a weak body defense system (immune system). Do a lot of swimming or diving. Overuse nasal sprays. Smoke. What are the signs or symptoms? The main symptoms of this condition are pain and a feeling of pressure around the affected sinuses. Other symptoms include: Stuffy nose or congestion that makes it difficult to breathe through your nose. Thick yellow or greenish drainage from your nose. Tenderness, swelling, and warmth over the affected sinuses. A cough that may get worse at night. Decreased sense of smell and taste. Extra mucus that collects in the throat or the back of the nose (postnasal drip) causing  a sore throat or bad breath. Tiredness (fatigue). Fever. How is this diagnosed? This condition is diagnosed based on: Your symptoms. Your medical history. A physical exam. Tests to find out if your condition is acute or chronic. This may include: Checking your nose for nasal polyps. Viewing your sinuses using a device that has a  light (endoscope). Testing for allergies or bacteria. Imaging tests, such as an MRI or CT scan. In rare cases, a bone biopsy may be done to rule out more serious types of fungal sinus disease. How is this treated? Treatment for a sinus infection depends on the cause and whether your condition is chronic or acute. If caused by a virus, your symptoms should go away on their own within 10 days. You may be given medicines to relieve symptoms. They include: Medicines that shrink swollen nasal passages (decongestants). A spray that eases inflammation of the nostrils (topical intranasal corticosteroids). Rinses that help get rid of thick mucus in your nose (nasal saline washes). Medicines that treat allergies (antihistamines). Over-the-counter pain relievers. If caused by bacteria, your health care provider may recommend waiting to see if your symptoms improve. Most bacterial infections will get better without antibiotic medicine. You may be given antibiotics if you have: A severe infection. A weak immune system. If caused by narrow nasal passages or nasal polyps, surgery may be needed. Follow these instructions at home: Medicines Take, use, or apply over-the-counter and prescription medicines only as told by your health care provider. These may include nasal sprays. If you were prescribed an antibiotic medicine, take it as told by your health care provider. Do not stop taking the antibiotic even if you start to feel better. Hydrate and humidify  Drink enough fluid to keep your urine pale yellow. Staying hydrated will help to thin your mucus. Use a cool mist humidifier to keep the humidity level in your home above 50%. Inhale steam for 10-15 minutes, 3-4 times a day, or as told by your health care provider. You can do this in the bathroom while a hot shower is running. Limit your exposure to cool or dry air. Rest Rest as much as possible. Sleep with your head raised (elevated). Make sure you  get enough sleep each night. General instructions  Apply a warm, moist washcloth to your face 3-4 times a day or as told by your health care provider. This will help with discomfort. Use nasal saline washes as often as told by your health care provider. Wash your hands often with soap and water to reduce your exposure to germs. If soap and water are not available, use hand sanitizer. Do not smoke. Avoid being around people who are smoking (secondhand smoke). Keep all follow-up visits. This is important. Contact a health care provider if: You have a fever. Your symptoms get worse. Your symptoms do not improve within 10 days. Get help right away if: You have a severe headache. You have persistent vomiting. You have severe pain or swelling around your face or eyes. You have vision problems. You develop confusion. Your neck is stiff. You have trouble breathing. These symptoms may be an emergency. Get help right away. Call 911. Do not wait to see if the symptoms will go away. Do not drive yourself to the hospital. Summary A sinus infection is soreness and inflammation of your sinuses. Sinuses are hollow spaces in the bones around your face. This condition is caused by nasal tissues that become inflamed or swollen. The swelling traps or blocks the flow  of mucus. This allows bacteria, viruses, and fungi to grow, which leads to infection. If you were prescribed an antibiotic medicine, take it as told by your health care provider. Do not stop taking the antibiotic even if you start to feel better. Keep all follow-up visits. This is important. This information is not intended to replace advice given to you by your health care provider. Make sure you discuss any questions you have with your health care provider. Document Revised: 02/02/2021 Document Reviewed: 02/02/2021 Elsevier Patient Education  2024 Elsevier Inc.   If you have been instructed to have an in-person evaluation today at a local  Urgent Care facility, please use the link below. It will take you to a list of all of our available [facility], including address, phone number and hours of operation. Please do not delay care.  [facility]  If you or a family member do not have a primary care provider, use the link below to schedule a visit and establish care. When you choose a [facility] primary care physician or advanced practice provider, you gain a long-term partner in health. Find a Primary Care Provider  Learn more about [facility]'s in-office and virtual care options: [facility] - Get Care Now

## 2023-04-21 NOTE — Progress Notes (Signed)
 Virtual Visit Consent   Judith Pitts, you are scheduled for a virtual visit with a [facility] provider today. Just as with appointments in the office, your consent must be obtained to participate. Your consent will be active for this visit and any virtual visit you may have with one of our providers in the next 365 days. If you have a MyChart account, a copy of this consent can be sent to you electronically.  As this is a virtual visit, video technology does not allow for your provider to perform a traditional examination. This may limit your provider's ability to fully assess your condition. If your provider identifies any concerns that need to be evaluated in person or the need to arrange testing (such as labs, EKG, etc.), we will make arrangements to do so. Although advances in technology are sophisticated, we cannot ensure that it will always work on either your end or our end. If the connection with a video visit is poor, the visit may have to be switched to a telephone visit. With either a video or telephone visit, we are not always able to ensure that we have a secure connection.  By engaging in this virtual visit, you consent to the provision of healthcare and authorize for your insurance to be billed (if applicable) for the services provided during this visit. Depending on your insurance coverage, you may receive a charge related to this service.  I need to obtain your verbal consent now. Are you willing to proceed with your visit today? Judith Pitts has provided verbal consent on 04/21/2023 for a virtual visit (video or telephone). Judith CHRISTELLA Dickinson, PA-C  Date: 04/21/2023 1:27 PM  Virtual Visit via Video Note   IDelon CHRISTELLA Pitts, connected with  Judith Pitts  ([id number], [id number]) on 04/21/23 at  1:30 PM EST by a video-enabled telemedicine application and verified that I am speaking with the correct person using two identifiers.  Location: Patient: Virtual Visit Location  Patient: Home Provider: Virtual Visit Location Provider: Home Office   I discussed the limitations of evaluation and management by telemedicine and the availability of in person appointments. The patient expressed understanding and agreed to proceed.    History of Present Illness: Judith Pitts is a 53 y.o. who identifies as a female who was assigned female at birth, and is being seen today for possible sinus infection.  HPI: Sinusitis This is a new problem. The current episode started in the past 7 days (Had started to improve but then worsened acutely yesterday). The problem has been gradually worsening since onset. The maximum temperature recorded prior to her arrival was 103 - 104 F (103 first day on Tuesday, then 101 on Wednesday, but improved). The pain is moderate. Associated symptoms include chills, congestion, coughing (throat clearing from drainage), diaphoresis, ear pain (not pain, but pressure last night), headaches, sinus pressure and a sore throat (from drainage). Treatments tried: tylenol, ibuprofen, vicks cold and flu. The treatment provided no relief.     Problems:  Patient Active Problem List   Diagnosis Date Noted   Hyperlipidemia 12/27/2022   OSA (obstructive sleep apnea) 08/09/2022   Snoring 06/23/2022   Abnormal cervical Papanicolaou smear 03/13/2019   Anemia 03/13/2019   Arthritis 03/13/2019   DERMATITIS 12/14/2009   HYPOTHYROIDISM, BORDERLINE 06/01/2009   DEPRESSION, MAJOR, HX OF 06/01/2009   ANEMIA, HX OF 06/01/2009    Allergies:  Allergies  Allergen Reactions   Lo Loestrin Fe [Norethin Ace-Eth Estrad-Fe] Hives  Pt reported    Medications:  Current Outpatient Medications:    amoxicillin -clavulanate (AUGMENTIN ) 875-125 MG tablet, Take 1 tablet by mouth 2 (two) times daily., Disp: 14 tablet, Rfl: 0   cetirizine (ZYRTEC) 10 MG tablet, Take 10 mg by mouth daily., Disp: , Rfl:    Semaglutide -Weight Management (WEGOVY ) 2.4 MG/0.75ML SOAJ, Inject 2.4 mg into  the skin once a week., Disp: 3 mL, Rfl: 5   triamcinolone  cream (KENALOG ) 0.1 %, Apply 1 application topically 2 (two) times daily., Disp: 30 g, Rfl: 1  Observations/Objective: Patient is well-developed, well-nourished in no acute distress.  Resting comfortably  at home.  Head is normocephalic, atraumatic.  No labored breathing. Speech is clear and coherent with logical content.  Patient is alert and oriented at baseline.    Assessment and Plan: 1. Acute bacterial sinusitis (Primary) - amoxicillin -clavulanate (AUGMENTIN ) 875-125 MG tablet; Take 1 tablet by mouth 2 (two) times daily.  Dispense: 14 tablet; Refill: 0  - Worsening symptoms that have not responded to OTC medications.  - Will give Augmentin   - Continue allergy medications.  - Steam and humidifier can help - Stay well hydrated and get plenty of rest.  - Seek in person evaluation if no symptom improvement or if symptoms worsen   Follow Up Instructions: I discussed the assessment and treatment plan with the patient. The patient was provided an opportunity to ask questions and all were answered. The patient agreed with the plan and demonstrated an understanding of the instructions.  A copy of instructions were sent to the patient via MyChart unless otherwise noted below.    The patient was advised to call back or seek an in-person evaluation if the symptoms worsen or if the condition fails to improve as anticipated.    Judith CHRISTELLA Dickinson, PA-C

## 2023-05-03 DIAGNOSIS — G4733 Obstructive sleep apnea (adult) (pediatric): Secondary | ICD-10-CM | POA: Diagnosis not present

## 2023-05-26 ENCOUNTER — Ambulatory Visit (INDEPENDENT_AMBULATORY_CARE_PROVIDER_SITE_OTHER): Payer: BC Managed Care – PPO | Admitting: Family Medicine

## 2023-05-26 VITALS — BP 110/72 | HR 54 | Temp 98.2°F | Ht 67.72 in | Wt 171.4 lb

## 2023-05-26 DIAGNOSIS — Z124 Encounter for screening for malignant neoplasm of cervix: Secondary | ICD-10-CM | POA: Diagnosis not present

## 2023-05-26 DIAGNOSIS — Z0000 Encounter for general adult medical examination without abnormal findings: Secondary | ICD-10-CM

## 2023-05-26 NOTE — Progress Notes (Signed)
 Established Patient Office Visit  Subjective   Patient ID: Judith Pitts, female    DOB: 1970/06/22  Age: 53 y.o. MRN: [id number]  Chief Complaint  Patient presents with   Annual Exam    HPI    Judith Pitts is seen for physical exam/wellness visit.  Generally healthy.  She does have history of obstructive sleep apnea and hyperlipidemia but overall low 10-year risk for CAD.  She had been taking [place] last year but then insurance quit covering this in January.  She was also having some possible side effects with Wegovy.  She is trying to maintain weight currently with lifestyle.   She does see gynecologist yearly and is getting annual mammograms and states her Pap smear is up-to-date.  Health maintenance reviewed:  Health Maintenance  Topic Date Due   COVID-19 Vaccine (1) Never done   HIV Screening  Never done   Cervical Cancer Screening (HPV/Pap Cotest)  Never done   MAMMOGRAM  08/14/2024   DTaP/Tdap/Td (3 - Tdap) 02/14/2029   Colonoscopy  11/14/2030   INFLUENZA VACCINE  Completed   Hepatitis C Screening  Completed   Zoster Vaccines- Shingrix  Completed   HPV VACCINES  Aged Out   She has had Shingrix -Declines pneumonia vaccine today.  Family history-father passed away this past year.  He had history of morbid obesity, type 2 diabetes, heart failure with preserved ejection fraction, and hypertension.  He also had early dementia.  Her mother lives in assisted living facility.  She has history of obesity, hypertension, atrial fibrillation.  Social history-married with 2 children and 7 grandchildren.  Never smoked.  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  Past Medical History:  Diagnosis Date   Anemia    hx of   Arthritis    bilateral knees/hips   Hyperlipidemia    diet controlled   Seasonal allergies    Past Surgical History:  Procedure Laterality Date   CESAREAN SECTION  1988   DILATION AND CURETTAGE OF UTERUS     x 2   EYE SURGERY  1983   TONSILLECTOMY      WISDOM TOOTH EXTRACTION      reports that she quit smoking about 32 years ago. Her smoking use included cigarettes. She started smoking about 37 years ago. She has a 5 pack-year smoking history. She has never used smokeless tobacco. She reports that she does not currently use alcohol after a past usage of about 3.0 standard drinks of alcohol per week. She reports that she does not currently use drugs after having used the following drugs: Marijuana. family history includes Arthritis in her father; Colon polyps in her father and paternal grandmother; Colon polyps (age of onset: 65) in her sister; Heart disease in her father and mother; Hypertension in her father and mother; Obesity in her father and mother. Allergies  Allergen Reactions   Lo Loestrin Fe [Norethin Ace-Eth Estrad-Fe] Hives    Pt reported    The 10-year ASCVD risk score (Arnett DK, et al., 2019) is: 1%   Values used to calculate the score:     Age: 10 years     Sex: Female     Is Non-Hispanic African American: No     Diabetic: No     Tobacco smoker: No     Systolic Blood Pressure: 110 mmHg     Is BP treated: No     HDL Cholesterol: 69.6 mg/dL     Total Cholesterol: 222 mg/dL   Review of  Systems  Constitutional:  Negative for chills, fever, malaise/fatigue and weight loss.  HENT:  Negative for hearing loss.   Eyes:  Negative for blurred vision and double vision.  Respiratory:  Negative for cough and shortness of breath.   Cardiovascular:  Negative for chest pain, palpitations and leg swelling.  Gastrointestinal:  Negative for abdominal pain, blood in stool, constipation and diarrhea.  Genitourinary:  Negative for dysuria.  Skin:  Negative for rash.  Neurological:  Negative for dizziness, speech change, seizures, loss of consciousness and headaches.  Psychiatric/Behavioral:  Negative for depression.       Objective:     BP 110/72 (BP Location: Left Arm, Patient Position: Sitting, Cuff Size: Normal)   Pulse (!) 54    Temp 98.2 F (36.8 C) (Oral)   Ht 5' 7.72" (1.72 m)   Wt 171 lb 6.4 oz (77.7 kg)   SpO2 95%   BMI 26.28 kg/m  BP Readings from Last 3 Encounters:  05/26/23 110/72  12/27/22 100/70  11/18/22 100/74   Wt Readings from Last 3 Encounters:  05/26/23 171 lb 6.4 oz (77.7 kg)  12/27/22 172 lb 11.2 oz (78.3 kg)  11/18/22 179 lb (81.2 kg)      Physical Exam Vitals reviewed.  Constitutional:      General: She is not in acute distress.    Appearance: She is well-developed.  HENT:     Head: Normocephalic and atraumatic.  Eyes:     Extraocular Movements: Extraocular movements intact.  Neck:     Thyroid: No thyromegaly.  Cardiovascular:     Rate and Rhythm: Normal rate and regular rhythm.     Heart sounds: Normal heart sounds. No murmur heard. Pulmonary:     Effort: No respiratory distress.     Breath sounds: Normal breath sounds. No wheezing or rales.  Abdominal:     General: Bowel sounds are normal. There is no distension.     Palpations: Abdomen is soft. There is no mass.     Tenderness: There is no abdominal tenderness. There is no guarding or rebound.  Musculoskeletal:        General: Normal range of motion.     Cervical back: Normal range of motion and neck supple.  Lymphadenopathy:     Cervical: No cervical adenopathy.  Neurological:     Mental Status: She is alert and oriented to person, place, and time.     Cranial Nerves: No cranial nerve deficit.  Psychiatric:        Behavior: Behavior normal.        Thought Content: Thought content normal.        Judgment: Judgment normal.      No results found for any visits on 05/26/23.  Last CBC Lab Results  Component Value Date   WBC 4.3 05/25/2022   HGB 14.6 05/25/2022   HCT 42.6 05/25/2022   MCV 95.4 05/25/2022   RDW 12.7 05/25/2022   PLT 290.0 05/25/2022   Last metabolic panel Lab Results  Component Value Date   GLUCOSE 97 05/25/2022   NA 139 05/25/2022   K 4.2 05/25/2022   CL 104 05/25/2022   CO2 27  05/25/2022   BUN 18 05/25/2022   CREATININE 0.92 05/25/2022   GFR 72.32 05/25/2022   CALCIUM 9.4 05/25/2022   PROT 7.6 09/26/2022   ALBUMIN 4.3 09/26/2022   BILITOT 0.4 09/26/2022   ALKPHOS 61 09/26/2022   AST 17 09/26/2022   ALT 16 09/26/2022   Last lipids Lab Results  Component Value Date   CHOL 222 (H) 05/25/2022   HDL 69.60 05/25/2022   LDLCALC 136 (H) 05/25/2022   TRIG 83.0 05/25/2022   CHOLHDL 3 05/25/2022      The 78-IONG ASCVD risk score (Arnett DK, et al., 2019) is: 1%    Assessment & Plan:   Physical exam-healthy 53 year old female.  We discussed the following health maintenance items  -Discussed possible coronary calcium score but for like for risk overall would be very low.  She was given information to read about this -She plans to continue with annual GYN follow-up for Pap smears and mammogram -We did discuss newer guidelines for pneumonia vaccination but she declines at this time -Continue annual flu vaccine -Shingrix already completed -Colonoscopy up-to-date -Obtain follow-up screening labs  No follow-ups on file.    Evelena Peat, MD

## 2023-05-30 ENCOUNTER — Other Ambulatory Visit (INDEPENDENT_AMBULATORY_CARE_PROVIDER_SITE_OTHER)

## 2023-05-30 DIAGNOSIS — Z0000 Encounter for general adult medical examination without abnormal findings: Secondary | ICD-10-CM

## 2023-05-30 LAB — HEPATIC FUNCTION PANEL
ALT: 15 U/L (ref 0–35)
AST: 17 U/L (ref 0–37)
Albumin: 4.6 g/dL (ref 3.5–5.2)
Alkaline Phosphatase: 63 U/L (ref 39–117)
Bilirubin, Direct: 0.1 mg/dL (ref 0.0–0.3)
Total Bilirubin: 0.6 mg/dL (ref 0.2–1.2)
Total Protein: 7.6 g/dL (ref 6.0–8.3)

## 2023-05-30 LAB — BASIC METABOLIC PANEL
BUN: 20 mg/dL (ref 6–23)
CO2: 27 meq/L (ref 19–32)
Calcium: 9.8 mg/dL (ref 8.4–10.5)
Chloride: 103 meq/L (ref 96–112)
Creatinine, Ser: 0.87 mg/dL (ref 0.40–1.20)
GFR: 76.79 mL/min (ref >60.00)
Glucose, Bld: 86 mg/dL (ref 70–99)
Potassium: 4.1 meq/L (ref 3.5–5.1)
Sodium: 139 meq/L (ref 135–145)

## 2023-05-30 LAB — LIPID PANEL
Cholesterol: 240 mg/dL — ABNORMAL HIGH (ref 0–200)
HDL: 76.7 mg/dL (ref >39.00)
LDL Cholesterol: 146 mg/dL — ABNORMAL HIGH (ref 0–99)
NonHDL: 163.51
Total CHOL/HDL Ratio: 3
Triglycerides: 87 mg/dL (ref 0.0–149.0)
VLDL: 17.4 mg/dL (ref 0.0–40.0)

## 2023-05-30 LAB — CBC WITH DIFFERENTIAL/PLATELET
Basophils Absolute: 0 10*3/uL (ref 0.0–0.1)
Basophils Relative: 1.2 % (ref 0.0–3.0)
Eosinophils Absolute: 0.1 10*3/uL (ref 0.0–0.7)
Eosinophils Relative: 3.7 % (ref 0.0–5.0)
HCT: 41.9 % (ref 36.0–46.0)
Hemoglobin: 14.1 g/dL (ref 12.0–15.0)
Lymphocytes Relative: 44.3 % (ref 12.0–46.0)
Lymphs Abs: 1.8 10*3/uL (ref 0.7–4.0)
MCHC: 33.8 g/dL (ref 30.0–36.0)
MCV: 93.6 fl (ref 78.0–100.0)
Monocytes Absolute: 0.3 10*3/uL (ref 0.1–1.0)
Monocytes Relative: 7.5 % (ref 3.0–12.0)
Neutro Abs: 1.7 10*3/uL (ref 1.4–7.7)
Neutrophils Relative %: 43.3 % (ref 43.0–77.0)
Platelets: 304 10*3/uL (ref 150.0–400.0)
RBC: 4.48 Mil/uL (ref 3.87–5.11)
RDW: 15 % (ref 11.5–15.5)
WBC: 4 10*3/uL (ref 4.0–10.5)

## 2023-05-31 DIAGNOSIS — G4733 Obstructive sleep apnea (adult) (pediatric): Secondary | ICD-10-CM | POA: Diagnosis not present

## 2023-06-06 ENCOUNTER — Other Ambulatory Visit

## 2023-06-16 ENCOUNTER — Encounter: Payer: Self-pay | Admitting: Family Medicine

## 2023-06-19 ENCOUNTER — Telehealth: Payer: Self-pay

## 2023-06-19 ENCOUNTER — Other Ambulatory Visit (HOSPITAL_COMMUNITY): Payer: Self-pay

## 2023-06-19 DIAGNOSIS — G4733 Obstructive sleep apnea (adult) (pediatric): Secondary | ICD-10-CM

## 2023-06-19 MED ORDER — TIRZEPATIDE-WEIGHT MANAGEMENT 2.5 MG/0.5ML ~~LOC~~ SOLN: 2.5000 mg | SUBCUTANEOUS | 0 refills | Status: DC

## 2023-06-19 NOTE — Telephone Encounter (Signed)
 Spoke with patient. PCP approves sending in Zepbound to [employer] program. Rx order sent.

## 2023-06-19 NOTE — Progress Notes (Signed)
   06/19/2023  Patient ID: Judith Pitts, female   DOB: [birth year], 53 y.o.   MRN: [id number]  Received message from PCP/CMA that patient was interested in discussing cost effective solutions for using GLP-1 for weight loss.  Her insurance no longer covers the GLP-1 class. Had been on Wegovy with good success but now that she is off, weight is starting to creep back up despite adherence to lifestyle modifications (diet/exercise).  Discussed [employer] program with patient and she would like to try the Zepbound vials. Discussed with PCP and approved.  Follow Up: 07/10/23

## 2023-06-19 NOTE — Telephone Encounter (Signed)
 Noted.

## 2023-07-03 ENCOUNTER — Telehealth: Payer: Self-pay

## 2023-07-03 MED ORDER — TIRZEPATIDE-WEIGHT MANAGEMENT 2.5 MG/0.5ML ~~LOC~~ SOLN: 2.5000 mg | SUBCUTANEOUS | 0 refills | Status: DC

## 2023-07-03 NOTE — Telephone Encounter (Signed)
 Copied from CRM [id number]. Topic: Clinical - Medication Question >> Jun 29, 2023  2:09 PM Alysia Jumbo S wrote: Reason for CRM: Att: "Angela-Pharmacist". Patient calling to follow-up on Zepbound  medication. Requesting a callback at [phone]

## 2023-07-03 NOTE — Telephone Encounter (Signed)
 Spoke with patient, previous rx never received. Sending to [employer] for processing.

## 2023-07-03 NOTE — Addendum Note (Signed)
 Addended by: Carnell Christian on: 07/03/2023 01:56 PM   Modules accepted: Orders

## 2023-07-10 ENCOUNTER — Other Ambulatory Visit

## 2023-07-19 ENCOUNTER — Other Ambulatory Visit: Payer: Self-pay | Admitting: Family Medicine

## 2023-08-02 ENCOUNTER — Other Ambulatory Visit (INDEPENDENT_AMBULATORY_CARE_PROVIDER_SITE_OTHER)

## 2023-08-02 DIAGNOSIS — E66811 Obesity, class 1: Secondary | ICD-10-CM

## 2023-08-02 MED ORDER — TIRZEPATIDE-WEIGHT MANAGEMENT 5 MG/0.5ML ~~LOC~~ SOLN: 5.0000 mg | SUBCUTANEOUS | 0 refills | Status: DC

## 2023-08-02 NOTE — Progress Notes (Signed)
   08/02/2023  Patient ID: Judith Pitts, female   DOB: [birth year], 53 y.o.   MRN: [id number]  Contacted patient to follow up on Zepbound  2.5mg  medication use.  Patient has given herself 5 doses (injects on Thursdays). Denies any side effects at all, only reports mildly curbed appetite.  Current Weight 173lbs  Patient would like to go up to 5mg  dose, coordinating with PCP. Counseled on importance of diet and exercise with medication use as well  Follow Up: 3 weeks  Carnell Christian, PharmD Clinical Pharmacist [phone]

## 2023-08-09 ENCOUNTER — Other Ambulatory Visit: Payer: Self-pay | Admitting: Family Medicine

## 2023-08-22 DIAGNOSIS — Z01419 Encounter for gynecological examination (general) (routine) without abnormal findings: Secondary | ICD-10-CM | POA: Diagnosis not present

## 2023-08-22 DIAGNOSIS — Z1231 Encounter for screening mammogram for malignant neoplasm of breast: Secondary | ICD-10-CM | POA: Diagnosis not present

## 2023-08-22 DIAGNOSIS — Z6826 Body mass index (BMI) 26.0-26.9, adult: Secondary | ICD-10-CM | POA: Diagnosis not present

## 2023-08-23 ENCOUNTER — Other Ambulatory Visit

## 2023-08-23 DIAGNOSIS — E663 Overweight: Secondary | ICD-10-CM

## 2023-08-23 NOTE — Progress Notes (Signed)
   08/23/2023  Patient ID: Polo Brisk, female   DOB: Jun 14, 1970, 53 y.o.   MRN: [id number]  Contacted patient to follow up on Zepbound  5mg  medication use.  Patient has given herself 2 doses of 5mg  so far. She achieved this by injecting two doses of leftover 2.5mg  vials. She denies any side effects from medication. Did notice that she had some raised itchy bumps at the injection site though and was curious what that may mean, was holding off on refilling her 5mg  rx with company until our appt.   Counseled patient on injection site reactions. Counseled on only 1 injection and that the 2 injections likely irritated the skin. Patient will continue on 5mg  and monitor for any reactions.  Follow Up: 3 weeks  Carnell Christian, PharmD Clinical Pharmacist [phone]

## 2023-09-07 ENCOUNTER — Other Ambulatory Visit: Payer: Self-pay | Admitting: Family Medicine

## 2023-09-18 ENCOUNTER — Other Ambulatory Visit

## 2023-09-18 ENCOUNTER — Telehealth: Payer: Self-pay

## 2023-09-18 NOTE — Progress Notes (Signed)
   09/18/2023  Patient ID: Judith Pitts, female   DOB: 07-14-1970, 53 y.o.   MRN: [id number]  Attempted to contact patient for scheduled appointment for medication management. Left HIPAA compliant message for patient to return my call at their convenience.   Jon VEAR Lindau, PharmD Clinical Pharmacist [phone]

## 2024-01-02 ENCOUNTER — Telehealth: Payer: Self-pay | Admitting: Family Medicine

## 2024-01-02 NOTE — Telephone Encounter (Signed)
 Patient dropped off document physician submitted labs form, to be filled out by provider. Patient requested to send it back via Call Patient to pick up within 5-days. Document is located in providers tray at front office.Please advise at Mobile [phone] (mobile)

## 2024-01-02 NOTE — Telephone Encounter (Signed)
 Form placed in front office for pickup and left message on patient vm informing her of this

## 2024-01-16 ENCOUNTER — Telehealth: Payer: Self-pay | Admitting: Family Medicine

## 2024-01-16 NOTE — Telephone Encounter (Signed)
 Copied from CRM [id number]. Topic: Clinical - Prescription Issue >> Jan 16, 2024 10:19 AM Mia F wrote: Reason for CRM: Pt says she was recently put on Zepbound  and she cannot financially afford the medication. She says she was on Wegovy  but was later denied by insurance due to it being just for weight loss. She says since stopping both medication she has began having pain in knees and hips due to weight gain.

## 2024-01-16 NOTE — Telephone Encounter (Unsigned)
 Copied from CRM [id number]. Topic: Clinical - Refused Triage >> Jan 16, 2024 10:22 AM Mia F wrote: Patient/caller voiced complaints of Pt has mentioned she has been having knee and hip pain since stopping Zepbound  due to weight increase. She is looking for a medication that is more affordable to help with her weight gain/ She says she think it athirst related and ibuprofen helps. She did not feel she needed to speak with NT at this time . Declined transfer to triage.

## 2024-01-17 NOTE — Telephone Encounter (Signed)
 Left detailed message on patient vm informing her of the message below

## 2024-01-17 NOTE — Telephone Encounter (Signed)
 Patient informed of the message below and voiced understanding.Patient inquired if there is an oral alternative that she can take for her weight?

## 2024-01-19 NOTE — Telephone Encounter (Signed)
 Patient informed of the message below and f/u scheduled

## 2024-01-30 ENCOUNTER — Ambulatory Visit (INDEPENDENT_AMBULATORY_CARE_PROVIDER_SITE_OTHER): Admitting: Family Medicine

## 2024-01-30 ENCOUNTER — Encounter: Payer: Self-pay | Admitting: Family Medicine

## 2024-01-30 VITALS — BP 128/90 | HR 75 | Temp 98.2°F | Wt 183.5 lb

## 2024-01-30 DIAGNOSIS — R635 Abnormal weight gain: Secondary | ICD-10-CM

## 2024-01-30 MED ORDER — PHENTERMINE HCL 37.5 MG PO CAPS: 37.5000 mg | ORAL_CAPSULE | ORAL | 2 refills | Status: AC

## 2024-01-30 NOTE — Progress Notes (Signed)
 Established Patient Office Visit  Subjective   Patient ID: Judith Pitts, female    DOB: 1970-07-06  Age: 53 y.o. MRN: [id number]  Chief Complaint  Patient presents with   Medication Consultation    HPI   [name] is seen today to discuss weight loss medications.  She has BMI over 29 and does have comorbidities of obstructive sleep apnea and hyperlipidemia.  She had good success with GLP-1 but insurance quit covering this.  She tried Qsymia previously but had some cognitive concerns with the Topamax.  She has tolerated plain phentermine in the past.  She struggled with losing on her own.  She has good knowledge of how to go about losing weight and knows the value of exercising regularly.  Does have a treadmill at home.  She is currently dealing with tremendous stress issues with helping care for her mom who has some dementia and multiple medical problems.  She feels like the stress and anxiety of this have augmented her issues with eating and weight gain  Past Medical History:  Diagnosis Date   Anemia    hx of   Arthritis    bilateral knees/hips   Hyperlipidemia    diet controlled   Seasonal allergies    Past Surgical History:  Procedure Laterality Date   CESAREAN SECTION  1988   DILATION AND CURETTAGE OF UTERUS     x 2   EYE SURGERY  1983   TONSILLECTOMY     WISDOM TOOTH EXTRACTION      reports that she quit smoking about 32 years ago. Her smoking use included cigarettes. She started smoking about 37 years ago. She has a 5 pack-year smoking history. She has never used smokeless tobacco. She reports that she does not currently use alcohol after a past usage of about 3.0 standard drinks of alcohol per week. She reports that she does not currently use drugs after having used the following drugs: Marijuana. family history includes Arthritis in her father; Colon polyps in her father and paternal grandmother; Colon polyps (age of onset: 60) in her sister; Heart disease in her father  and mother; Hypertension in her father and mother; Obesity in her father and mother. Allergies  Allergen Reactions   Lo Loestrin Fe [Norethin Ace-Eth Estrad-Fe] Hives    Pt reported     Review of Systems  Respiratory:  Negative for shortness of breath.   Cardiovascular:  Negative for chest pain.  Gastrointestinal:  Negative for abdominal pain.  Psychiatric/Behavioral:  Positive for depression. The patient is nervous/anxious.       Objective:     BP (!) 128/90   Pulse 75   Temp 98.2 F (36.8 C) (Oral)   Wt 183 lb 8 oz (83.2 kg)   SpO2 98%   BMI 28.13 kg/m  BP Readings from Last 3 Encounters:  01/30/24 (!) 128/90  05/26/23 110/72  12/27/22 100/70   Wt Readings from Last 3 Encounters:  01/30/24 183 lb 8 oz (83.2 kg)  05/26/23 171 lb 6.4 oz (77.7 kg)  12/27/22 172 lb 11.2 oz (78.3 kg)      Physical Exam Vitals reviewed.  Constitutional:      General: She is not in acute distress.    Appearance: She is not ill-appearing.  Cardiovascular:     Rate and Rhythm: Normal rate and regular rhythm.  Pulmonary:     Effort: Pulmonary effort is normal.     Breath sounds: Normal breath sounds. No wheezing or rales.  Neurological:     Mental Status: She is alert.  Psychiatric:        Mood and Affect: Mood normal.      No results found for any visits on 01/30/24.    The 10-year ASCVD risk score (Arnett DK, et al., 2019) is: 1.3%    Assessment & Plan:   Weight gain/overweight.  Comorbidities including obstructive sleep apnea and hyperlipidemia.  Vivienne has done well in past with GLP-1 medications but her insurance is refusing to cover.  She did participate with [employer] plan previously but this was still very costly.  After much discussion regarding options, she would like to to consider going back on phentermine 37.5 mg once daily.  She is aware of potential side effects including palpitations and insomnia.  She knows to take this in the morning.  We wrote for #30 with  2 refills and follow-up in 3 months to reassess  [name], MD

## 2024-02-06 DIAGNOSIS — R232 Flushing: Secondary | ICD-10-CM | POA: Diagnosis not present

## 2024-04-09 ENCOUNTER — Encounter: Payer: Self-pay | Admitting: Family Medicine

## 2024-04-30 ENCOUNTER — Ambulatory Visit: Admitting: Family Medicine

## 2024-05-28 ENCOUNTER — Encounter: Admitting: Family Medicine
